# Patient Record
Sex: Female | Born: 1950
Health system: Southern US, Community
[De-identification: ages and names within clinical notes are randomized; demographics above are authoritative.]

## PROBLEM LIST (undated history)

## (undated) DIAGNOSIS — M1990 Unspecified osteoarthritis, unspecified site: Secondary | ICD-10-CM

## (undated) DIAGNOSIS — M79606 Pain in leg, unspecified: Secondary | ICD-10-CM

## (undated) DIAGNOSIS — E7800 Pure hypercholesterolemia, unspecified: Secondary | ICD-10-CM

## (undated) DIAGNOSIS — D649 Anemia, unspecified: Secondary | ICD-10-CM

## (undated) DIAGNOSIS — Z8619 Personal history of other infectious and parasitic diseases: Secondary | ICD-10-CM

## (undated) DIAGNOSIS — Z9289 Personal history of other medical treatment: Secondary | ICD-10-CM

## (undated) DIAGNOSIS — E785 Hyperlipidemia, unspecified: Secondary | ICD-10-CM

## (undated) DIAGNOSIS — K219 Gastro-esophageal reflux disease without esophagitis: Secondary | ICD-10-CM

## (undated) DIAGNOSIS — E039 Hypothyroidism, unspecified: Secondary | ICD-10-CM

## (undated) DIAGNOSIS — E079 Disorder of thyroid, unspecified: Secondary | ICD-10-CM

## (undated) HISTORY — DX: Pain in leg, unspecified: M79.606

## (undated) HISTORY — DX: Pure hypercholesterolemia, unspecified: E78.00

## (undated) HISTORY — DX: Disorder of thyroid, unspecified: E07.9

## (undated) HISTORY — DX: Unspecified osteoarthritis, unspecified site: M19.90

## (undated) HISTORY — PX: COLONOSCOPY: SHX174

## (undated) HISTORY — DX: Gastro-esophageal reflux disease without esophagitis: K21.9

## (undated) HISTORY — DX: Hyperlipidemia, unspecified: E78.5

---

## 1987-09-14 HISTORY — PX: DILATION AND CURETTAGE OF UTERUS: SHX78

## 1998-02-07 ENCOUNTER — Ambulatory Visit (HOSPITAL_COMMUNITY): Admission: RE | Admit: 1998-02-07 | Discharge: 1998-02-07 | Payer: Self-pay | Admitting: *Deleted

## 1998-03-07 ENCOUNTER — Other Ambulatory Visit: Admission: RE | Admit: 1998-03-07 | Discharge: 1998-03-07 | Payer: Self-pay | Admitting: *Deleted

## 1999-07-31 ENCOUNTER — Ambulatory Visit (HOSPITAL_COMMUNITY): Admission: RE | Admit: 1999-07-31 | Discharge: 1999-07-31 | Payer: Self-pay | Admitting: *Deleted

## 1999-07-31 ENCOUNTER — Encounter: Payer: Self-pay | Admitting: *Deleted

## 1999-08-14 ENCOUNTER — Other Ambulatory Visit: Admission: RE | Admit: 1999-08-14 | Discharge: 1999-08-14 | Payer: Self-pay | Admitting: Obstetrics & Gynecology

## 2000-01-29 ENCOUNTER — Ambulatory Visit (HOSPITAL_COMMUNITY): Admission: RE | Admit: 2000-01-29 | Discharge: 2000-01-29 | Payer: Self-pay | Admitting: Gastroenterology

## 2000-08-26 ENCOUNTER — Encounter (INDEPENDENT_AMBULATORY_CARE_PROVIDER_SITE_OTHER): Payer: Self-pay | Admitting: Specialist

## 2000-08-26 ENCOUNTER — Ambulatory Visit (HOSPITAL_COMMUNITY): Admission: RE | Admit: 2000-08-26 | Discharge: 2000-08-26 | Payer: Self-pay | Admitting: *Deleted

## 2000-08-26 ENCOUNTER — Encounter: Payer: Self-pay | Admitting: *Deleted

## 2000-08-26 ENCOUNTER — Other Ambulatory Visit: Admission: RE | Admit: 2000-08-26 | Discharge: 2000-08-26 | Payer: Self-pay | Admitting: Obstetrics and Gynecology

## 2001-01-10 ENCOUNTER — Other Ambulatory Visit: Admission: RE | Admit: 2001-01-10 | Discharge: 2001-01-10 | Payer: Self-pay | Admitting: Obstetrics & Gynecology

## 2001-01-10 ENCOUNTER — Encounter (INDEPENDENT_AMBULATORY_CARE_PROVIDER_SITE_OTHER): Payer: Self-pay | Admitting: Specialist

## 2001-09-22 ENCOUNTER — Ambulatory Visit (HOSPITAL_COMMUNITY): Admission: RE | Admit: 2001-09-22 | Discharge: 2001-09-22 | Payer: Self-pay | Admitting: *Deleted

## 2001-09-22 ENCOUNTER — Encounter: Admission: RE | Admit: 2001-09-22 | Discharge: 2001-09-22 | Payer: Self-pay | Admitting: Family Medicine

## 2001-09-22 ENCOUNTER — Encounter: Payer: Self-pay | Admitting: Family Medicine

## 2001-09-22 ENCOUNTER — Encounter: Payer: Self-pay | Admitting: *Deleted

## 2002-10-12 ENCOUNTER — Ambulatory Visit (HOSPITAL_COMMUNITY): Admission: RE | Admit: 2002-10-12 | Discharge: 2002-10-12 | Payer: Self-pay | Admitting: Family Medicine

## 2002-10-12 ENCOUNTER — Encounter: Payer: Self-pay | Admitting: Family Medicine

## 2002-11-23 ENCOUNTER — Other Ambulatory Visit: Admission: RE | Admit: 2002-11-23 | Discharge: 2002-11-23 | Payer: Self-pay | Admitting: Obstetrics and Gynecology

## 2003-11-22 ENCOUNTER — Encounter: Admission: RE | Admit: 2003-11-22 | Discharge: 2003-11-22 | Payer: Self-pay | Admitting: Obstetrics and Gynecology

## 2004-12-18 ENCOUNTER — Encounter: Admission: RE | Admit: 2004-12-18 | Discharge: 2004-12-18 | Payer: Self-pay | Admitting: Family Medicine

## 2005-06-04 ENCOUNTER — Encounter: Admission: RE | Admit: 2005-06-04 | Discharge: 2005-06-04 | Payer: Self-pay | Admitting: Family Medicine

## 2005-12-24 ENCOUNTER — Encounter: Admission: RE | Admit: 2005-12-24 | Discharge: 2005-12-24 | Payer: Self-pay | Admitting: Family Medicine

## 2007-01-27 ENCOUNTER — Encounter: Admission: RE | Admit: 2007-01-27 | Discharge: 2007-01-27 | Payer: Self-pay | Admitting: Family Medicine

## 2008-02-09 ENCOUNTER — Encounter: Admission: RE | Admit: 2008-02-09 | Discharge: 2008-02-09 | Payer: Self-pay | Admitting: Family Medicine

## 2009-02-14 ENCOUNTER — Encounter: Admission: RE | Admit: 2009-02-14 | Discharge: 2009-02-14 | Payer: Self-pay | Admitting: Obstetrics and Gynecology

## 2010-02-20 ENCOUNTER — Encounter: Admission: RE | Admit: 2010-02-20 | Discharge: 2010-02-20 | Payer: Self-pay | Admitting: Obstetrics and Gynecology

## 2011-01-29 NOTE — Op Note (Signed)
[facility]  Patient:    Kelsey Bradford, Kelsey Bradford                        MRN: [id number] Proc. Date: 01/29/00 Adm. Date:  [id number] Disc. Date: [id number] Attending:  Rich Brave CC:         Kendrick Ranch, M.D.                           Operative Report  PROCEDURE:  Colonoscopy.  INDICATIONS:  Screening for colon cancer in a 60 year old female.  FINDINGS:  Normal, except for rare colonic diverticulum.  PROCEDURE:  The nature, purpose and risks of the procedure had been discussed.  The patient provided written consent.  Sedation was fentanyl 25 mcg and Versed 4.5 mg IV; without arrhythmias or desaturation.  The Olympus adult video colonoscope was quite easily advanced into the cecum, turning the patient into the supine position to facilitate passage.  Pullback was then performed.  The quality of the prep was excellent, and it is felt that all areas were well seen.  There was a rare colonic diverticulum noted, but this was otherwise a normal examination; without evidence of any polyps or cancer, nor any colitis or vascular malformations.  Retroflexion in the rectum was normal.  No biopsies were obtained.  The patient tolerated the procedure well and there were no apparent complications.  IMPRESSION:  Minimal diverticulosis, otherwise normal examination.  PLAN:  Consider follow-up colonoscopy in 5-10 years for screening purposes. DD:  01/29/00 TD:  02/03/00 Job: [id number]

## 2011-03-12 ENCOUNTER — Other Ambulatory Visit: Payer: Self-pay | Admitting: Obstetrics and Gynecology

## 2011-03-12 DIAGNOSIS — Z1231 Encounter for screening mammogram for malignant neoplasm of breast: Secondary | ICD-10-CM

## 2011-04-09 ENCOUNTER — Ambulatory Visit
Admission: RE | Admit: 2011-04-09 | Discharge: 2011-04-09 | Disposition: A | Discharge status: Home / Self Care | Payer: BC Managed Care – PPO | Source: Ambulatory Visit | Attending: Obstetrics and Gynecology | Admitting: Obstetrics and Gynecology

## 2011-04-09 DIAGNOSIS — Z1231 Encounter for screening mammogram for malignant neoplasm of breast: Secondary | ICD-10-CM

## 2011-05-28 ENCOUNTER — Encounter: Payer: Self-pay | Admitting: Cardiology

## 2011-08-18 ENCOUNTER — Encounter (INDEPENDENT_AMBULATORY_CARE_PROVIDER_SITE_OTHER): Payer: Self-pay | Admitting: Surgery

## 2012-03-02 ENCOUNTER — Other Ambulatory Visit: Payer: Self-pay | Admitting: Obstetrics and Gynecology

## 2012-03-02 DIAGNOSIS — Z1231 Encounter for screening mammogram for malignant neoplasm of breast: Secondary | ICD-10-CM

## 2012-03-31 ENCOUNTER — Other Ambulatory Visit: Payer: Self-pay | Admitting: Gastroenterology

## 2012-03-31 DIAGNOSIS — R1900 Intra-abdominal and pelvic swelling, mass and lump, unspecified site: Secondary | ICD-10-CM

## 2012-04-07 ENCOUNTER — Ambulatory Visit
Admission: RE | Admit: 2012-04-07 | Discharge: 2012-04-07 | Disposition: A | Discharge status: Home / Self Care | Payer: BC Managed Care – PPO | Source: Ambulatory Visit | Attending: Gastroenterology | Admitting: Gastroenterology

## 2012-04-07 DIAGNOSIS — R1900 Intra-abdominal and pelvic swelling, mass and lump, unspecified site: Secondary | ICD-10-CM

## 2012-04-07 MED ORDER — IOHEXOL 300 MG/ML  SOLN
100.0000 mL | Freq: Once | INTRAMUSCULAR | Status: AC | PRN
  Administered 2012-04-07: 100 mL via INTRAVENOUS

## 2012-05-26 ENCOUNTER — Ambulatory Visit
Admission: RE | Admit: 2012-05-26 | Discharge: 2012-05-26 | Disposition: A | Discharge status: Home / Self Care | Payer: BC Managed Care – PPO | Source: Ambulatory Visit | Attending: Obstetrics and Gynecology | Admitting: Obstetrics and Gynecology

## 2012-05-26 DIAGNOSIS — Z1231 Encounter for screening mammogram for malignant neoplasm of breast: Secondary | ICD-10-CM

## 2012-07-14 ENCOUNTER — Encounter (INDEPENDENT_AMBULATORY_CARE_PROVIDER_SITE_OTHER): Payer: Self-pay | Admitting: Surgery

## 2012-07-14 ENCOUNTER — Ambulatory Visit (INDEPENDENT_AMBULATORY_CARE_PROVIDER_SITE_OTHER): Payer: BC Managed Care – PPO | Admitting: Surgery

## 2012-07-14 VITALS — BP 118/68 | HR 76 | Temp 97.6°F | Resp 16 | Ht 61.5 in | Wt 118.8 lb

## 2012-07-14 DIAGNOSIS — K8020 Calculus of gallbladder without cholecystitis without obstruction: Secondary | ICD-10-CM | POA: Insufficient documentation

## 2012-07-14 DIAGNOSIS — K439 Ventral hernia without obstruction or gangrene: Secondary | ICD-10-CM | POA: Insufficient documentation

## 2012-07-14 DIAGNOSIS — K5730 Diverticulosis of large intestine without perforation or abscess without bleeding: Secondary | ICD-10-CM

## 2012-07-14 NOTE — Patient Instructions (Signed)
Call and request a time when you want to schedule Spigelian hernia repair.  Would repair the right side and examine the left side and repair if necessary.  I would plan to do this with the laparoscope and possibly repair with a combined open technique.      PATIENT INSTRUCTIONS HERNIA  FOLLOW-UP:  Please make an appointment with your physician in 4 week(s).  Call your physician immediately if you have any fevers greater than 102.5, drainage from you wound that is not clear or looks infected, persistent bleeding, increasing abdominal pain, problems urinating, or persistent nausea/vomiting.    WOUND CARE INSTRUCTIONS:  Keep a dry clean dressing on the wound if there is drainage. The initial bandage may be removed after 24 hours.  Once the wound has quit draining you may leave it open to air.  If clothing rubs against the wound or causes irritation and the wound is not draining you may cover it with a dry dressing during the daytime.  Try to keep the wound dry and avoid ointments on the wound unless directed to do so.  If the wound becomes bright red and painful or starts to drain infected material that is not clear, please contact your physician immediately.  If the wound is mildly pink and has a thick firm ridge underneath it, this is normal, and is referred to as a healing ridge.  This will resolve over the next 4-6 weeks.  DIET:  You may eat any foods that you can tolerate.  It is a good idea to eat a high fiber diet and take in plenty of fluids to prevent constipation.  If you do become constipated you may want to take a mild laxative or take ducolax tablets on a daily basis until your bowel habits are regular.  Constipation can be very uncomfortable, along with straining, after recent abdominal surgery.  ACTIVITY:  You are encouraged to cough and deep breath or use your incentive spirometer if you were given one, every 15-30 minutes when awake.  This will help prevent respiratory complications and  low grade fevers post-operatively.  You may want to hug a pillow when coughing and sneezing to add additional support to the surgical area which will decrease pain during these times.  You are encouraged to walk and engage in light activity for the next two weeks.  You should not lift more than 20 pounds during this time frame as it could put you at increased risk for a hernia recurrence.  Twenty pounds is roughly equivalent to a plastic bag of groceries.    MEDICATIONS:  Try to take narcotic medications and anti-inflammatory medications, such as tylenol, ibuprofen, naprosyn, etc., with food.  This will minimize stomach upset from the medication.  Should you develop nausea and vomiting from the pain medication, or develop a rash, please discontinue the medication and contact your physician.  You should not drive, make important decisions, or operate machinery when taking narcotic pain medication.  QUESTIONS:  Please feel free to call your physician or the hospital operator if you have any questions, and they will be glad to assist you.

## 2012-07-14 NOTE — Progress Notes (Signed)
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  She underwent a CT scan which did not show any mass but showed a right Spigelian hernia and a smaller left hernia.  Also noted were gallstones.  She has been otherwise healthy and recently begun working out with a Systems analyst.    Past Medical History  Diagnosis Date  . GERD (gastroesophageal reflux disease)   . Hyperlipidemia   . Thyroid disease     History reviewed. No pertinent past surgical history.  Current Outpatient Prescriptions  Medication Sig Dispense Refill  . aspirin 81 MG tablet Take 81 mg by mouth daily.      . calcium carbonate (OS-CAL) 600 MG TABS Take 600 mg by mouth 2 (two) times daily with a meal.      . cholecalciferol (VITAMIN D) 1000 UNITS tablet Take 1,000 Units by mouth daily.      Marland Kitchen levothyroxine (SYNTHROID, LEVOTHROID) 50 MCG tablet Take 50 mcg by mouth daily.      Marland Kitchen omeprazole (PRILOSEC) 20 MG capsule Take 20 mg by mouth daily.      Marland Kitchen OVER THE COUNTER MEDICATION Multi vitamin      . rosuvastatin (CRESTOR) 10 MG tablet Take 10 mg by mouth daily.       Review of patient's allergies indicates no known allergies. Family History  Problem Relation Age of Onset  . Cancer Mother     breast  . Stroke Mother   . Diabetes Father    Social History:   reports that she has never smoked. She has never used smokeless tobacco. She reports that she drinks alcohol. She reports that she does not use illicit drugs.   REVIEW OF SYSTEMS - PERTINENT POSITIVES ONLY: No history of DVT or bleeding problems with surgey  Physical Exam:   Blood pressure 118/68, pulse 76, temperature 97.6 F (36.4 C), temperature source Temporal, resp. rate 16, height 5' 1.5" (1.562 m), weight 118 lb 12.8 oz (53.887 kg). Body mass index is 22.08 kg/(m^2).  Gen:   WDWN WF NAD  Neurological: Alert and oriented to person, place, and time. Motor and sensory function is grossly intact  Head: Normocephalic and atraumatic.  Eyes: Conjunctivae are normal. Pupils are equal, round, and reactive to light. No scleral icterus.  Neck: Normal range of motion. Neck supple. No tracheal deviation or thyromegaly present.  Cardiovascular:  SR without murmurs or gallops.  No carotid bruits Respiratory: Effort normal.  No respiratory distress. No chest wall tenderness. Breath sounds normal.  No wheezes, rales or rhonchi.  Abdomen:  Faint bulge in the right lower quadrant but not bulging out as much as she has noticed previously.   GU: Musculoskeletal: Normal range of motion. Extremities are nontender. No cyanosis, edema or clubbing noted Lymphadenopathy: No cervical, preauricular, postauricular or axillary adenopathy is present Skin: Skin is warm and dry. No rash noted. No diaphoresis. No erythema. No pallor. Pscyh: Normal mood and affect. Behavior is normal. Judgment and thought content normal.   LABORATORY RESULTS: No results found for this or any previous visit (from the past 48 hour(s)).  RADIOLOGY RESULTS: No results found.  Problem List: Patient Active Problem List  Diagnosis  . Gallstones-asymptomatic  . Diverticulosis of colon  . Spigelian hernia    Assessment & Plan: Symptomatic Spigelian hernia  on the right with asymptomatic hernia on the left.  Will schedule lap/open repair at [facility] when it suits her schedule.      Matt B. Daphine Deutscher, MD, [facility] [phone] beeper [phone]  07/14/2012 4:37 PM

## 2012-07-19 ENCOUNTER — Other Ambulatory Visit (INDEPENDENT_AMBULATORY_CARE_PROVIDER_SITE_OTHER): Payer: Self-pay | Admitting: Surgery

## 2012-08-25 ENCOUNTER — Encounter (HOSPITAL_COMMUNITY): Payer: Self-pay | Admitting: *Deleted

## 2012-08-25 ENCOUNTER — Other Ambulatory Visit (HOSPITAL_COMMUNITY): Payer: Self-pay | Admitting: *Deleted

## 2012-08-28 ENCOUNTER — Encounter (HOSPITAL_COMMUNITY): Payer: Self-pay | Admitting: Pharmacy Technician

## 2012-09-01 ENCOUNTER — Encounter (HOSPITAL_COMMUNITY): Payer: Self-pay | Admitting: *Deleted

## 2012-09-01 ENCOUNTER — Encounter (HOSPITAL_COMMUNITY): Payer: Self-pay | Admitting: Anesthesiology

## 2012-09-01 ENCOUNTER — Other Ambulatory Visit (INDEPENDENT_AMBULATORY_CARE_PROVIDER_SITE_OTHER): Payer: Self-pay

## 2012-09-01 ENCOUNTER — Encounter (HOSPITAL_COMMUNITY)
Admission: RE | Disposition: A | Discharge status: Other Acute Inpatient Hospital | Payer: Self-pay | Source: Ambulatory Visit | Attending: Surgery

## 2012-09-01 ENCOUNTER — Ambulatory Visit (HOSPITAL_COMMUNITY): Payer: BC Managed Care – PPO | Admitting: Anesthesiology

## 2012-09-01 ENCOUNTER — Ambulatory Visit (HOSPITAL_COMMUNITY)
Admission: RE | Admit: 2012-09-01 | Discharge: 2012-09-01 | Payer: BC Managed Care – PPO | Source: Ambulatory Visit | Attending: Surgery | Admitting: Surgery

## 2012-09-01 DIAGNOSIS — K219 Gastro-esophageal reflux disease without esophagitis: Secondary | ICD-10-CM | POA: Insufficient documentation

## 2012-09-01 DIAGNOSIS — K4090 Unilateral inguinal hernia, without obstruction or gangrene, not specified as recurrent: Secondary | ICD-10-CM

## 2012-09-01 DIAGNOSIS — E785 Hyperlipidemia, unspecified: Secondary | ICD-10-CM | POA: Insufficient documentation

## 2012-09-01 DIAGNOSIS — K439 Ventral hernia without obstruction or gangrene: Secondary | ICD-10-CM | POA: Insufficient documentation

## 2012-09-01 DIAGNOSIS — Z79899 Other long term (current) drug therapy: Secondary | ICD-10-CM | POA: Insufficient documentation

## 2012-09-01 DIAGNOSIS — E079 Disorder of thyroid, unspecified: Secondary | ICD-10-CM | POA: Insufficient documentation

## 2012-09-01 DIAGNOSIS — Z7982 Long term (current) use of aspirin: Secondary | ICD-10-CM | POA: Insufficient documentation

## 2012-09-01 HISTORY — PX: LAPAROSCOPIC ABDOMINAL EXPLORATION: SHX6249

## 2012-09-01 HISTORY — PX: SPIGELIAN HERNIA: SHX6100

## 2012-09-01 HISTORY — DX: Hypothyroidism, unspecified: E03.9

## 2012-09-01 HISTORY — PX: INSERTION OF MESH: SHX5868

## 2012-09-01 HISTORY — PX: INGUINAL HERNIA REPAIR: SHX194

## 2012-09-01 LAB — CBC
MCH: 30.7 pg (ref 26.0–34.0)
Platelets: 259 10*3/uL (ref 150–400)
RBC: 4.33 MIL/uL (ref 3.87–5.11)
RDW: 13.2 % (ref 11.5–15.5)
WBC: 5.3 10*3/uL (ref 4.0–10.5)

## 2012-09-01 SURGERY — REPAIR, HERNIA, INGUINAL, ADULT
Anesthesia: General | Laterality: Right | Wound class: Clean

## 2012-09-01 MED ORDER — ONDANSETRON HCL 4 MG PO TABS
4.0000 mg | ORAL_TABLET | Freq: Four times a day (QID) | ORAL | Status: DC | PRN
  Filled 2012-09-01: qty 1

## 2012-09-01 MED ORDER — HYDROCODONE-ACETAMINOPHEN 5-500 MG PO TABS: 1.0000 | ORAL_TABLET | ORAL | Status: DC | PRN

## 2012-09-01 MED ORDER — ACETAMINOPHEN 10 MG/ML IV SOLN
INTRAVENOUS | Status: DC | PRN
  Administered 2012-09-01: 1000 mg via INTRAVENOUS

## 2012-09-01 MED ORDER — DEXAMETHASONE SODIUM PHOSPHATE 10 MG/ML IJ SOLN
INTRAMUSCULAR | Status: DC | PRN
  Administered 2012-09-01: 10 mg via INTRAVENOUS

## 2012-09-01 MED ORDER — LACTATED RINGERS IV SOLN: INTRAVENOUS | Status: DC

## 2012-09-01 MED ORDER — PROMETHAZINE HCL 25 MG/ML IJ SOLN
6.2500 mg | INTRAMUSCULAR | Status: DC | PRN
  Administered 2012-09-01: 6.25 mg via INTRAVENOUS
  Filled 2012-09-01: qty 1

## 2012-09-01 MED ORDER — 0.9 % SODIUM CHLORIDE (POUR BTL) OPTIME
TOPICAL | Status: DC | PRN
  Administered 2012-09-01: 1000 mL

## 2012-09-01 MED ORDER — ESMOLOL HCL 10 MG/ML IV SOLN
INTRAVENOUS | Status: DC | PRN
  Administered 2012-09-01: 20 mg via INTRAVENOUS

## 2012-09-01 MED ORDER — EPHEDRINE SULFATE 50 MG/ML IJ SOLN
INTRAMUSCULAR | Status: DC | PRN
  Administered 2012-09-01: 5 mg via INTRAVENOUS

## 2012-09-01 MED ORDER — BUPIVACAINE-EPINEPHRINE PF 0.25-1:200000 % IJ SOLN
INTRAMUSCULAR | Status: AC
  Filled 2012-09-01: qty 30

## 2012-09-01 MED ORDER — LIDOCAINE HCL (CARDIAC) 20 MG/ML IV SOLN
INTRAVENOUS | Status: DC | PRN
  Administered 2012-09-01: 80 mg via INTRAVENOUS

## 2012-09-01 MED ORDER — MIDAZOLAM HCL 5 MG/5ML IJ SOLN
INTRAMUSCULAR | Status: DC | PRN
  Administered 2012-09-01: 2 mg via INTRAVENOUS

## 2012-09-01 MED ORDER — HEPARIN SODIUM (PORCINE) 5000 UNIT/ML IJ SOLN
5000.0000 [IU] | Freq: Once | INTRAMUSCULAR | Status: AC
  Administered 2012-09-01: 5000 [IU] via SUBCUTANEOUS
  Filled 2012-09-01: qty 1

## 2012-09-01 MED ORDER — FENTANYL CITRATE 0.05 MG/ML IJ SOLN
INTRAMUSCULAR | Status: DC | PRN
  Administered 2012-09-01 (×5): 50 ug via INTRAVENOUS

## 2012-09-01 MED ORDER — NEOSTIGMINE METHYLSULFATE 1 MG/ML IJ SOLN
INTRAMUSCULAR | Status: DC | PRN
  Administered 2012-09-01: 3 mg via INTRAVENOUS

## 2012-09-01 MED ORDER — MUPIROCIN 2 % EX OINT
TOPICAL_OINTMENT | Freq: Once | CUTANEOUS | Status: DC
  Filled 2012-09-01: qty 22

## 2012-09-01 MED ORDER — ONDANSETRON HCL 4 MG/2ML IJ SOLN
INTRAMUSCULAR | Status: DC | PRN
  Administered 2012-09-01: 4 mg via INTRAVENOUS

## 2012-09-01 MED ORDER — HEPARIN SODIUM (PORCINE) 5000 UNIT/ML IJ SOLN
5000.0000 [IU] | Freq: Three times a day (TID) | INTRAMUSCULAR | Status: DC
  Filled 2012-09-01 (×6): qty 1

## 2012-09-01 MED ORDER — CEFOXITIN SODIUM-DEXTROSE 1-4 GM-% IV SOLR (PREMIX)
INTRAVENOUS | Status: AC
  Filled 2012-09-01: qty 100

## 2012-09-01 MED ORDER — OXYCODONE-ACETAMINOPHEN 5-325 MG PO TABS: 1.0000 | ORAL_TABLET | ORAL | Status: DC | PRN

## 2012-09-01 MED ORDER — HEPARIN SODIUM (PORCINE) 5000 UNIT/ML IJ SOLN
5000.0000 [IU] | Freq: Three times a day (TID) | INTRAMUSCULAR | Status: DC
  Filled 2012-09-01 (×5): qty 1

## 2012-09-01 MED ORDER — MORPHINE SULFATE 10 MG/ML IJ SOLN: 1.0000 mg | INTRAMUSCULAR | Status: DC | PRN

## 2012-09-01 MED ORDER — ONDANSETRON HCL 4 MG/2ML IJ SOLN: 4.0000 mg | Freq: Four times a day (QID) | INTRAMUSCULAR | Status: DC | PRN

## 2012-09-01 MED ORDER — FLEET ENEMA 7-19 GM/118ML RE ENEM: 1.0000 | ENEMA | Freq: Once | RECTAL | Status: DC

## 2012-09-01 MED ORDER — BUPIVACAINE-EPINEPHRINE 0.25% -1:200000 IJ SOLN
INTRAMUSCULAR | Status: DC | PRN
  Administered 2012-09-01: 15 mL

## 2012-09-01 MED ORDER — PROPOFOL 10 MG/ML IV BOLUS
INTRAVENOUS | Status: DC | PRN
  Administered 2012-09-01: 150 mg via INTRAVENOUS

## 2012-09-01 MED ORDER — ROCURONIUM BROMIDE 100 MG/10ML IV SOLN
INTRAVENOUS | Status: DC | PRN
  Administered 2012-09-01: 5 mg via INTRAVENOUS
  Administered 2012-09-01: 10 mg via INTRAVENOUS
  Administered 2012-09-01: 30 mg via INTRAVENOUS

## 2012-09-01 MED ORDER — GLYCOPYRROLATE 0.2 MG/ML IJ SOLN
INTRAMUSCULAR | Status: DC | PRN
  Administered 2012-09-01: 0.4 mg via INTRAVENOUS

## 2012-09-01 MED ORDER — HYDROMORPHONE HCL PF 1 MG/ML IJ SOLN: 0.2500 mg | INTRAMUSCULAR | Status: DC | PRN

## 2012-09-01 MED ORDER — HYDROMORPHONE HCL PF 1 MG/ML IJ SOLN
INTRAMUSCULAR | Status: DC | PRN
  Administered 2012-09-01: 0.5 mg via INTRAVENOUS

## 2012-09-01 MED ORDER — LACTATED RINGERS IV SOLN
INTRAVENOUS | Status: DC | PRN
  Administered 2012-09-01 (×2): via INTRAVENOUS

## 2012-09-01 MED ORDER — DEXTROSE 5 % IV SOLN
2.0000 g | INTRAVENOUS | Status: AC
  Administered 2012-09-01: 2 g via INTRAVENOUS
  Filled 2012-09-01: qty 2

## 2012-09-01 MED ORDER — BUPIVACAINE LIPOSOME 1.3 % IJ SUSP
20.0000 mL | Freq: Once | INTRAMUSCULAR | Status: AC
  Administered 2012-09-01: 20 mL
  Filled 2012-09-01: qty 20

## 2012-09-01 MED ORDER — KCL IN DEXTROSE-NACL 20-5-0.45 MEQ/L-%-% IV SOLN: INTRAVENOUS | Status: DC

## 2012-09-01 MED ORDER — ACETAMINOPHEN 10 MG/ML IV SOLN
INTRAVENOUS | Status: AC
  Filled 2012-09-01: qty 100

## 2012-09-01 SURGICAL SUPPLY — 56 items
BINDER ABD UNIV 12 45-62 (WOUND CARE) IMPLANT
BINDER ABDOMINAL 46IN 62IN (WOUND CARE)
CANISTER SUCTION 2500CC (MISCELLANEOUS) ×4 IMPLANT
CANNULA ENDOPATH XCEL 11M (ENDOMECHANICALS) IMPLANT
CLOTH BEACON ORANGE TIMEOUT ST (SAFETY) ×4 IMPLANT
DECANTER SPIKE VIAL GLASS SM (MISCELLANEOUS) ×4 IMPLANT
DERMABOND ADVANCED (GAUZE/BANDAGES/DRESSINGS) ×3
DERMABOND ADVANCED .7 DNX12 (GAUZE/BANDAGES/DRESSINGS) ×9 IMPLANT
DEVICE SECURE STRAP 25 ABSORB (INSTRUMENTS) ×4 IMPLANT
DEVICE TROCAR PUNCTURE CLOSURE (ENDOMECHANICALS) ×4 IMPLANT
DISSECTOR BLUNT TIP ENDO 5MM (MISCELLANEOUS) IMPLANT
DRAIN CHANNEL RND F F (WOUND CARE) IMPLANT
DRAPE LAPAROSCOPIC ABDOMINAL (DRAPES) ×4 IMPLANT
ELECT CAUTERY BLADE 6.4 (BLADE) ×4 IMPLANT
ELECT REM PT RETURN 9FT ADLT (ELECTROSURGICAL) ×4
ELECTRODE REM PT RTRN 9FT ADLT (ELECTROSURGICAL) ×3 IMPLANT
EVACUATOR SILICONE 100CC (DRAIN) IMPLANT
GLOVE BIOGEL M 8.0 STRL (GLOVE) ×4 IMPLANT
GLOVE SURG SS PI 6.5 STRL IVOR (GLOVE) ×4 IMPLANT
GOWN STRL NON-REIN LRG LVL3 (GOWN DISPOSABLE) ×4 IMPLANT
GOWN STRL REIN XL XLG (GOWN DISPOSABLE) ×8 IMPLANT
HAND ACTIVATED (MISCELLANEOUS) IMPLANT
IV LACTATED RINGER IRRG 3000ML (IV SOLUTION)
IV LR IRRIG 3000ML ARTHROMATIC (IV SOLUTION) IMPLANT
KIT BASIN OR (CUSTOM PROCEDURE TRAY) ×4 IMPLANT
MARKER SKIN DUAL TIP RULER LAB (MISCELLANEOUS) ×4 IMPLANT
MESH BARD SOFT 2X4IN (Mesh General) ×4 IMPLANT
NEEDLE SPNL 22GX3.5 QUINCKE BK (NEEDLE) ×4 IMPLANT
NS IRRIG 1000ML POUR BTL (IV SOLUTION) ×4 IMPLANT
PATCH VENTRAL SMALL 4.3 (Mesh Specialty) ×4 IMPLANT
PENCIL BUTTON HOLSTER BLD 10FT (ELECTRODE) ×4 IMPLANT
SET IRRIG TUBING LAPAROSCOPIC (IRRIGATION / IRRIGATOR) IMPLANT
SLEEVE XCEL OPT CAN 5 100 (ENDOMECHANICALS) ×4 IMPLANT
SOLUTION ANTI FOG 6CC (MISCELLANEOUS) ×4 IMPLANT
STAPLER VISISTAT 35W (STAPLE) ×4 IMPLANT
SUT NOVA 0 T19/GS 22DT (SUTURE) IMPLANT
SUT NOVA 1 T20/GS 25DT (SUTURE) IMPLANT
SUT NOVA NAB DX-16 0-1 5-0 T12 (SUTURE) IMPLANT
SUT PROLENE 0 CT 1 CR/8 (SUTURE) IMPLANT
SUT PROLENE 2 0 CT2 30 (SUTURE) ×28 IMPLANT
SUT SILK 2 0 (SUTURE) ×1
SUT SILK 2-0 18XBRD TIE 12 (SUTURE) ×3 IMPLANT
SUT VIC AB 2-0 SH 27 (SUTURE) ×1
SUT VIC AB 2-0 SH 27X BRD (SUTURE) ×3 IMPLANT
SUT VIC AB 4-0 SH 18 (SUTURE) ×4 IMPLANT
SYR 30ML LL (SYRINGE) ×4 IMPLANT
TACKER 5MM HERNIA 3.5CML NAB (ENDOMECHANICALS) IMPLANT
TRAY FOLEY CATH 14FRSI W/METER (CATHETERS) ×4 IMPLANT
TRAY LAP CHOLE (CUSTOM PROCEDURE TRAY) ×4 IMPLANT
TROCAR BLADELESS OPT 5 100 (ENDOMECHANICALS) ×4 IMPLANT
TROCAR HASSON GELL 12X100 (TROCAR) IMPLANT
TROCAR Z-THREAD FIOS 11X100 BL (TROCAR) ×4 IMPLANT
TROCAR Z-THREAD FIOS 5X100MM (TROCAR) ×4 IMPLANT
TROCAR Z-THREAD SLEEVE 11X100 (TROCAR) IMPLANT
TUBING FILTER THERMOFLATOR (ELECTROSURGICAL) IMPLANT
TUBING INSUFFLATION 10FT LAP (TUBING) ×4 IMPLANT

## 2012-09-01 NOTE — H&P (Signed)
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 She underwent a CT scan which did not show any mass but showed a right Spigelian hernia and a smaller left hernia. Also noted were gallstones. She has been otherwise healthy and recently begun working out with a Systems analyst.  Past Medical History   Diagnosis  Date   .  GERD (gastroesophageal reflux disease)    .  Hyperlipidemia    .  Thyroid disease    History reviewed. No pertinent past surgical history.  Current Outpatient Prescriptions   Medication  Sig  Dispense  Refill   .  aspirin 81 MG tablet  Take 81 mg by mouth daily.     .  calcium carbonate (OS-CAL) 600 MG TABS  Take 600 mg by mouth 2 (two) times daily with a meal.     .  cholecalciferol (VITAMIN D) 1000 UNITS tablet  Take 1,000 Units by mouth daily.     Marland Kitchen  levothyroxine (SYNTHROID, LEVOTHROID) 50 MCG tablet  Take 50 mcg by mouth daily.     Marland Kitchen  omeprazole (PRILOSEC) 20 MG capsule  Take 20 mg by mouth daily.     Marland Kitchen  OVER THE COUNTER MEDICATION  Multi vitamin     .  rosuvastatin (CRESTOR) 10 MG tablet  Take 10 mg by mouth daily.     Review of patient's allergies indicates no known allergies.  Family History   Problem  Relation  Age of Onset   .  Cancer  Mother       breast    .  Stroke  Mother    .  Diabetes  Father    Social History: reports that she has never smoked. She has never used smokeless tobacco. She reports that she drinks alcohol. She reports that she does not use illicit drugs.  REVIEW OF SYSTEMS - PERTINENT POSITIVES ONLY:  No history of DVT or bleeding problems with surgey  Physical Exam:  Blood pressure 118/68, pulse 76, temperature 97.6 F (36.4 C), temperature source Temporal, resp. rate 16, height 5' 1.5" (1.562 m), weight 118 lb 12.8 oz (53.887 kg).  Body mass index is 22.08  kg/(m^2).  Gen: WDWN WF NAD  Neurological: Alert and oriented to person, place, and time. Motor and sensory function is grossly intact  Head: Normocephalic and atraumatic.  Eyes: Conjunctivae are normal. Pupils are equal, round, and reactive to light. No scleral icterus.  Neck: Normal range of motion. Neck supple. No tracheal deviation or thyromegaly present.  Cardiovascular: SR without murmurs or gallops. No carotid bruits  Respiratory: Effort normal. No respiratory distress. No chest wall tenderness. Breath sounds normal. No wheezes, rales or rhonchi.  Abdomen: Faint bulge in the right lower quadrant but not bulging out as much as she has noticed previously.  GU:  Musculoskeletal: Normal range of motion. Extremities are nontender. No cyanosis, edema or clubbing noted Lymphadenopathy: No cervical, preauricular, postauricular or axillary adenopathy is present Skin: Skin is warm and dry. No rash noted. No diaphoresis. No erythema. No pallor. Pscyh: Normal mood and affect. Behavior is normal. Judgment and thought content normal.  LABORATORY RESULTS:  No results found for this or any previous visit (from the past 48 hour(s)).  RADIOLOGY RESULTS:  No results found.  Problem List:  Patient Active Problem List  Diagnosis   .  Gallstones-asymptomatic   .  Diverticulosis of colon   .  Spigelian hernia   Assessment & Plan:  Symptomatic Spigelian hernia on the right with asymptomatic hernia on the left. For laparoscopic repair today.  She understands the indications and risks. Matt B. Daphine Deutscher, MD, [facility]  [phone] beeper  [phone]

## 2012-09-01 NOTE — Transfer of Care (Signed)
Immediate Anesthesia Transfer of Care Note  Patient: Kelsey Desanctis, MD  Procedure(s) Performed: Procedure(s) (LRB) with comments: HERNIA REPAIR INGUINAL ADULT (Right) - laparoscopic assisted LAPAROSCOPIC ABDOMINAL EXPLORATION (N/A) SPIGELIAN HERNIA (Right) - laparoscopic assisted INSERTION OF MESH (Right) - spigelian and inguinal  Patient Location: PACU  Anesthesia Type:General  Level of Consciousness: awake, alert , oriented and patient cooperative  Airway & Oxygen Therapy: Patient Spontanous Breathing and Patient connected to face mask oxygen  Post-op Assessment: Report given to PACU RN, Post -op Vital signs reviewed and stable and Patient moving all extremities  Post vital signs: Reviewed and stable  Complications: No apparent anesthesia complications

## 2012-09-01 NOTE — Anesthesia Preprocedure Evaluation (Addendum)
Anesthesia Evaluation  Patient identified by MRN, date of birth, ID band Patient awake    Reviewed: Allergy & Precautions, H&P , NPO status , Patient's Chart, lab work & pertinent test results  History of Anesthesia Complications Negative for: history of anesthetic complications  Airway Mallampati: II TM Distance: >3 FB Neck ROM: Full    Dental  (+) Teeth Intact and Dental Advisory Given   Pulmonary neg pulmonary ROS,  breath sounds clear to auscultation  Pulmonary exam normal       Cardiovascular negative cardio ROS  Rhythm:Regular Rate:Normal     Neuro/Psych negative neurological ROS  negative psych ROS   GI/Hepatic Neg liver ROS, GERD-  Medicated,  Endo/Other  Hypothyroidism   Renal/GU negative Renal ROS  negative genitourinary   Musculoskeletal negative musculoskeletal ROS (+)   Abdominal   Peds  Hematology negative hematology ROS (+)   Anesthesia Other Findings   Reproductive/Obstetrics negative OB ROS                         Anesthesia Physical Anesthesia Plan  ASA: I  Anesthesia Plan: General   Post-op Pain Management:    Induction: Intravenous  Airway Management Planned: Oral ETT  Additional Equipment:   Intra-op Plan:   Post-operative Plan: Extubation in OR  Informed Consent: I have reviewed the patients History and Physical, chart, labs and discussed the procedure including the risks, benefits and alternatives for the proposed anesthesia with the patient or authorized representative who has indicated his/her understanding and acceptance.   Dental advisory given  Plan Discussed with: CRNA  Anesthesia Plan Comments:         Anesthesia Quick Evaluation

## 2012-09-01 NOTE — Op Note (Signed)
Surgeon: Wenda Low, MD, FACS  Asst:  none  Anes:  general  Procedure: Lap assisted repair of right Spigellian hernia with Proceed mesh patch (4.3 cm); right indirect inguinal hernia repair  Diagnosis: Right Spigellian hernia and right indirect inguinal hernia  Complications: non  EBL:   minimal cc  Description of Procedure:  The patient was taken to room 11 and given general anesthesia. The abdomen was prepped with PCMX and draped sterilely. A timeout was performed. Access to the abdomen was achieved with a 0 5 mm Optiview through the right upper quadrant without difficulty. Abdomen was insufflated and surveyed with first a 0 scope and then an angle scope.  In the spot that I had marked preoperatively where she had pain she appeared to have an inguinal hernia. Measurement of the abdominal wall and identification of the linea semilunaris and its intersection with the lateral border of the rectus revealed a small (hernia. This was approached first a first holding a dissecting probe into the hernia sac which was probably centimeter in depth and then cutting down and from the outside and initially trying to repair it primarily with a figure-of-eight suture of Prolene. I felt that there is a to be too much tension on that so I elected to excise the sac and through the resultant 1/2 cm defect I inserted a piece of proceed mesh 4.3 cm in diameter. I sutured it to the perimeter with 2-0 Prolene to and attempted to tack it with a tacker but found that the tacker would not penetrate the proceed mesh. I then placed 3 2-0 Prolene sutures around the perimeter to further anchor it away from the defect and this secured nicely to the fascia gave a nice appearance flush up against the intra-abdominal wall.  Inguinal hernia was identified and again using laparoscopic vision and then I cut down on that a transverse fashion in its and found the external oblique to be splayed. Cold cut along the splayed fascia to  the external ring and then mobilized prominent hernia along with the gubernaculum. Elected to transect the gubernaculum and then reduced the hernia and the gubernaculum into the abdomen. I closed the floor with interrupted 2-0 Prolene. I cut a piece of soft roll between mesh and laid it on top of that and sutured it with interrupted sutures to the fascia. I then covered this with the external oblique which I closed with a running 2-0 Vicryl. Both wound incisions were injected with Exparel. The wounds were closed 4-0 Vicryl subcutaneously and with 4-0 Vicryl subcuticularly and with Dermabond. Dermabond was also used on the trocar sites. At the completion of the case when [facility] insufflated and surveyed both hernia repairs in and looked good from the inside. Did not see an appreciable left inguinal hernia or left spigelian hernia and opted not to intervene in that region. Patient are the procedure well taken recovery room in satisfactory condition.  Matt B. Daphine Deutscher, MD, [facility], [place] [phone]

## 2012-09-01 NOTE — Anesthesia Postprocedure Evaluation (Signed)
  Anesthesia Post-op Note  Patient: Orene Desanctis, MD  Procedure(s) Performed: Procedure(s) (LRB): HERNIA REPAIR INGUINAL ADULT (Right) LAPAROSCOPIC ABDOMINAL EXPLORATION (N/A) SPIGELIAN HERNIA (Right) INSERTION OF MESH (Right)  Patient Location: PACU  Anesthesia Type: General  Level of Consciousness: awake and alert   Airway and Oxygen Therapy: Patient Spontanous Breathing  Post-op Pain: mild  Post-op Assessment: Post-op Vital signs reviewed, Patient's Cardiovascular Status Stable, Respiratory Function Stable, Patent Airway and No signs of Nausea or vomiting  Last Vitals:  Filed Vitals:   09/01/12 1615  BP: 120/75  Pulse:   Temp: 36.3 C  Resp:     Post-op Vital Signs: stable   Complications: No apparent anesthesia complications

## 2012-09-01 NOTE — Preoperative (Signed)
Beta Blockers   Reason not to administer Beta Blockers:Not Applicable 

## 2012-09-04 ENCOUNTER — Encounter (HOSPITAL_COMMUNITY): Payer: Self-pay | Admitting: Surgery

## 2012-09-11 NOTE — Anesthesia Postprocedure Evaluation (Signed)
Anesthesia Post Note  Patient: Orene Desanctis, MD  Procedure(s) Performed: Procedure(s) (LRB): HERNIA REPAIR INGUINAL ADULT (Right) LAPAROSCOPIC ABDOMINAL EXPLORATION (N/A) SPIGELIAN HERNIA (Right) INSERTION OF MESH (Right)  Anesthesia type: General  Patient location: PACU  Post pain: Pain level controlled  Post assessment: Post-op Vital signs reviewed  Last Vitals:  Filed Vitals:   09/01/12 1915  BP: 117/81  Pulse: 64  Temp: 36.3 C  Resp: 16    Post vital signs: Reviewed  Level of consciousness: sedated  Complications: No apparent anesthesia complications

## 2012-09-15 NOTE — Discharge Summary (Signed)
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  Admit date: 09/01/2012 Discharge date: 09/01/12  Admission Diagnoses:  Abdominal wall herniae  Discharge Diagnoses:  same Active Problems:  * No active hospital problems. *    Surgery:  Repair of Spigelian hernia right and repair right inguinal hernia  Discharged Condition: good.  Was discharged from short stay and never went to the floor  Hospital Course:   Had surgery and was discharged from short stay  Consults:none  Significant Diagnostic Studies: none    Discharge Exam: Blood pressure 117/81, pulse 64, temperature 97.3 F (36.3 C), temperature source Oral, resp. rate 16, height 5\' 1"  (1.549 m), weight 118 lb (53.524 kg), SpO2 100.00%. Pain controlled  Disposition: [facility] Not Defined  Discharge Orders    Future Appointments: Provider: Department: Dept Phone: Center:   10/06/2012 10:30 AM Valarie Merino, MD [facility], [place] [phone] None       Medication List     As of 09/15/2012  7:19 AM    ASK your doctor about these medications         aspirin 81 MG tablet   Take 81 mg by mouth daily.      calcium carbonate 600 MG Tabs   Commonly known as: OS-CAL   Take 600 mg by mouth 2 (two) times daily with a meal.      cholecalciferol 1000 UNITS tablet   Commonly known as: VITAMIN D   Take 1,000 Units by mouth daily.      HYDROcodone-acetaminophen 5-500 MG per tablet   Commonly known as: VICODIN   Take 1 tablet by mouth every 4 (four) hours as needed for pain.      levothyroxine 50 MCG tablet   Commonly known as: SYNTHROID, LEVOTHROID   Take 50 mcg by mouth daily before breakfast.      multivitamin with minerals Tabs   Take 1 tablet by mouth daily.      omeprazole 20 MG capsule   Commonly known as: PRILOSEC   Take 20 mg by mouth daily before breakfast.      rosuvastatin 10 MG tablet   Commonly known as:  CRESTOR   Take 10 mg by mouth daily before breakfast.         Signed: Alfa Leibensperger B 09/15/2012, 7:19 AM

## 2012-09-19 ENCOUNTER — Encounter (INDEPENDENT_AMBULATORY_CARE_PROVIDER_SITE_OTHER): Payer: Self-pay | Admitting: Surgery

## 2012-09-19 ENCOUNTER — Ambulatory Visit (INDEPENDENT_AMBULATORY_CARE_PROVIDER_SITE_OTHER): Payer: BC Managed Care – PPO | Admitting: Surgery

## 2012-09-19 VITALS — BP 100/70 | HR 72 | Temp 97.5°F | Resp 18 | Wt 121.0 lb

## 2012-09-19 DIAGNOSIS — K4090 Unilateral inguinal hernia, without obstruction or gangrene, not specified as recurrent: Secondary | ICD-10-CM

## 2012-09-19 NOTE — Progress Notes (Signed)
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  There is no height on file to calculate BMI.  Patient Active Problem List  Diagnosis  . Gallstones-asymptomatic  . Diverticulosis of colon  . Spigelian hernia    No Known Allergies  Past Surgical History  Procedure Date  . Dilation and curettage of uterus 1989    for miscarriage  . Inguinal hernia repair 09/01/2012    Procedure: HERNIA REPAIR INGUINAL ADULT;  Surgeon: Valarie Merino, MD;  Location: [facility]S;  Service: General;  Laterality: Right;  laparoscopic assisted  . Laparoscopic abdominal exploration 09/01/2012    Procedure: LAPAROSCOPIC ABDOMINAL EXPLORATION;  Surgeon: Valarie Merino, MD;  Location: [facility];  Service: General;  Laterality: N/A;  . Spigelian hernia 09/01/2012    Procedure: SPIGELIAN HERNIA;  Surgeon: Valarie Merino, MD;  Location: [facility];  Service: General;  Laterality: Right;  laparoscopic assisted  . Insertion of mesh 09/01/2012    Procedure: INSERTION OF MESH;  Surgeon: Valarie Merino, MD;  Location: [facility];  Service: General;  Laterality: Right;  spigelian and inguinal   COUSINS,SHERONETTE A, MD No diagnosis found.  Kelsey Bradford had onset of sharp pain near the midline.  This is in the area of her spigelian hernia mesh repair. I think one of the sutures is pulled there and intermittently causes her to have sharp pain. I think I would treat this with Motrin and observation. Unscheduled see her back in the office after she returns from a conference in [place]. On palpation I feel no evidence for hernia either in the inguinal region or the spigelian region. Matt B. Daphine Deutscher, MD, [facility] [phone] beeper [phone]  09/19/2012 3:44 PM

## 2012-09-27 ENCOUNTER — Encounter (INDEPENDENT_AMBULATORY_CARE_PROVIDER_SITE_OTHER): Payer: BC Managed Care – PPO | Admitting: Surgery

## 2012-10-03 ENCOUNTER — Encounter (INDEPENDENT_AMBULATORY_CARE_PROVIDER_SITE_OTHER): Payer: Self-pay

## 2012-10-06 ENCOUNTER — Encounter (INDEPENDENT_AMBULATORY_CARE_PROVIDER_SITE_OTHER): Payer: Self-pay | Admitting: Surgery

## 2012-10-06 ENCOUNTER — Ambulatory Visit (INDEPENDENT_AMBULATORY_CARE_PROVIDER_SITE_OTHER): Payer: PRIVATE HEALTH INSURANCE | Admitting: Surgery

## 2012-10-06 VITALS — BP 102/82 | HR 72 | Temp 98.0°F | Resp 16 | Ht 61.5 in | Wt 120.0 lb

## 2012-10-06 DIAGNOSIS — Z9889 Other specified postprocedural states: Secondary | ICD-10-CM

## 2012-10-06 DIAGNOSIS — Z8719 Personal history of other diseases of the digestive system: Secondary | ICD-10-CM | POA: Insufficient documentation

## 2012-10-06 NOTE — Progress Notes (Signed)
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  Body mass index is 22.31 kg/(m^2).  Patient Active Problem List  Diagnosis  . Gallstones-asymptomatic  . Diverticulosis of colon    No Known Allergies  Past Surgical History  Procedure Date  . Dilation and curettage of uterus 1989    for miscarriage  . Inguinal hernia repair 09/01/2012    Procedure: HERNIA REPAIR INGUINAL ADULT;  Surgeon: Valarie Merino, MD;  Location: [facility];  Service: General;  Laterality: Right;  laparoscopic assisted  . Laparoscopic abdominal exploration 09/01/2012    Procedure: LAPAROSCOPIC ABDOMINAL EXPLORATION;  Surgeon: Valarie Merino, MD;  Location: [facility];  Service: General;  Laterality: N/A;  . Spigelian hernia 09/01/2012    Procedure: SPIGELIAN HERNIA;  Surgeon: Valarie Merino, MD;  Location: [facility];  Service: General;  Laterality: Right;  laparoscopic assisted  . Insertion of mesh 09/01/2012    Procedure: INSERTION OF MESH;  Surgeon: Valarie Merino, MD;  Location: [facility];  Service: General;  Laterality: Right;  spigelian and inguinal   COUSINS,SHERONETTE A, MD No diagnosis found.  Feeling better but having some numbness in the right groin.  This is bothering her somewhat.  Less pain at site of Spigellian hernia.  Advised to return to level of activity as desired.   Return in 3 months--may cancel if not needed.  Matt B. Daphine Deutscher, MD, [facility] [phone] beeper [phone]  10/06/2012 11:01 AM

## 2012-10-25 ENCOUNTER — Encounter (INDEPENDENT_AMBULATORY_CARE_PROVIDER_SITE_OTHER): Payer: BC Managed Care – PPO | Admitting: Surgery

## 2013-01-01 ENCOUNTER — Other Ambulatory Visit: Payer: Self-pay | Admitting: Internal Medicine

## 2013-01-01 DIAGNOSIS — M25551 Pain in right hip: Secondary | ICD-10-CM

## 2013-01-02 ENCOUNTER — Ambulatory Visit
Admission: RE | Admit: 2013-01-02 | Discharge: 2013-01-02 | Disposition: A | Discharge status: Home / Self Care | Payer: No Typology Code available for payment source | Source: Ambulatory Visit | Attending: Internal Medicine | Admitting: Internal Medicine

## 2013-01-02 DIAGNOSIS — M25551 Pain in right hip: Secondary | ICD-10-CM

## 2013-02-09 ENCOUNTER — Other Ambulatory Visit: Payer: Self-pay | Admitting: Orthopedic Surgery

## 2013-02-13 ENCOUNTER — Ambulatory Visit
Admission: RE | Admit: 2013-02-13 | Discharge: 2013-02-13 | Disposition: A | Discharge status: Home / Self Care | Payer: No Typology Code available for payment source | Source: Ambulatory Visit | Attending: Orthopedic Surgery | Admitting: Orthopedic Surgery

## 2013-02-16 ENCOUNTER — Ambulatory Visit (INDEPENDENT_AMBULATORY_CARE_PROVIDER_SITE_OTHER): Payer: No Typology Code available for payment source | Admitting: Neurology

## 2013-02-16 ENCOUNTER — Encounter: Payer: Self-pay | Admitting: Neurology

## 2013-02-16 VITALS — BP 115/80 | HR 81 | Temp 97.8°F | Ht 61.0 in | Wt 117.0 lb

## 2013-02-16 DIAGNOSIS — M79609 Pain in unspecified limb: Secondary | ICD-10-CM

## 2013-02-16 DIAGNOSIS — R269 Unspecified abnormalities of gait and mobility: Secondary | ICD-10-CM

## 2013-02-16 DIAGNOSIS — R29898 Other symptoms and signs involving the musculoskeletal system: Secondary | ICD-10-CM

## 2013-02-16 MED ORDER — PREDNISONE 5 MG PO TABS: 10.0000 mg | ORAL_TABLET | Freq: Every day | ORAL | Status: DC

## 2013-02-16 NOTE — Patient Instructions (Addendum)
Fall Prevention and Home Safety Falls cause injuries and can affect all age groups. It is possible to use preventive measures to significantly decrease the likelihood of falls. There are many simple measures which can make your home safer and prevent falls. OUTDOORS  Repair cracks and edges of walkways and driveways.  Remove high doorway thresholds.  Trim shrubbery on the main path into your home.  Have good outside lighting.  Clear walkways of tools, rocks, debris, and clutter.  Check that handrails are not broken and are securely fastened. Both sides of steps should have handrails.  Have leaves, snow, and ice cleared regularly.  Use sand or salt on walkways during winter months.  In the garage, clean up grease or oil spills. BATHROOM  Install night lights.  Install grab bars by the toilet and in the tub and shower.  Use non-skid mats or decals in the tub or shower.  Place a plastic non-slip stool in the shower to sit on, if needed.  Keep floors dry and clean up all water on the floor immediately.  Remove soap buildup in the tub or shower on a regular basis.  Secure bath mats with non-slip, double-sided rug tape.  Remove throw rugs and tripping hazards from the floors. BEDROOMS  Install night lights.  Make sure a bedside light is easy to reach.  Do not use oversized bedding.  Keep a telephone by your bedside.  Have a firm chair with side arms to use for getting dressed.  Remove throw rugs and tripping hazards from the floor. KITCHEN  Keep handles on pots and pans turned toward the center of the stove. Use back burners when possible.  Clean up spills quickly and allow time for drying.  Avoid walking on wet floors.  Avoid hot utensils and knives.  Position shelves so they are not too high or low.  Place commonly used objects within easy reach.  If necessary, use a sturdy step stool with a grab bar when reaching.  Keep electrical cables out of the  way.  Do not use floor polish or wax that makes floors slippery. If you must use wax, use non-skid floor wax.  Remove throw rugs and tripping hazards from the floor. STAIRWAYS  Never leave objects on stairs.  Place handrails on both sides of stairways and use them. Fix any loose handrails. Make sure handrails on both sides of the stairways are as long as the stairs.  Check carpeting to make sure it is firmly attached along stairs. Make repairs to worn or loose carpet promptly.  Avoid placing throw rugs at the top or bottom of stairways, or properly secure the rug with carpet tape to prevent slippage. Get rid of throw rugs, if possible.  Have an electrician put in a light switch at the top and bottom of the stairs. OTHER FALL PREVENTION TIPS  Wear low-heel or rubber-soled shoes that are supportive and fit well. Wear closed toe shoes.  When using a stepladder, make sure it is fully opened and both spreaders are firmly locked. Do not climb a closed stepladder.  Add color or contrast paint or tape to grab bars and handrails in your home. Place contrasting color strips on first and last steps.  Learn and use mobility aids as needed. Install an electrical emergency response system.  Turn on lights to avoid dark areas. Replace light bulbs that burn out immediately. Get light switches that glow.  Arrange furniture to create clear pathways. Keep furniture in the same place.    Firmly attach carpet with non-skid or double-sided tape.  Eliminate uneven floor surfaces.  Select a carpet pattern that does not visually hide the edge of steps.  Be aware of all pets. OTHER HOME SAFETY TIPS  Set the water temperature for 120 F (48.8 C).  Keep emergency numbers on or near the telephone.  Keep smoke detectors on every level of the home and near sleeping areas. Document Released: 08/20/2002 Document Revised: 02/29/2012 Document Reviewed: 11/19/2011 [facility] Patient Information 2014  [place], [place]. Myalgia, Adult Myalgia is the medical term for muscle pain. It is a symptom of many things. Nearly everyone at some time in their life has this. The most common cause for muscle pain is overuse or straining and more so when you are not in shape. Injuries and muscle bruises cause myalgias. Muscle pain without a history of injury can also be caused by a virus. It frequently comes along with the flu. Myalgia not caused by muscle strain can be present in a large number of infectious diseases. Some autoimmune diseases like lupus and fibromyalgia can cause muscle pain. Myalgia may be mild, or severe. SYMPTOMS  The symptoms of myalgia are simply muscle pain. Most of the time this is short lived and the pain goes away without treatment. DIAGNOSIS  Myalgia is diagnosed by your caregiver by taking your history. This means you tell him when the problems began, what they are, and what has been happening. If this has not been a long term problem, your caregiver may want to watch for a while to see what will happen. If it has been long term, they may want to do additional testing. TREATMENT  The treatment depends on what the underlying cause of the muscle pain is. Often anti-inflammatory medications will help. HOME CARE INSTRUCTIONS  If the pain in your muscles came from overuse, slow down your activities until the problems go away.  Myalgia from overuse of a muscle can be treated with alternating hot and cold packs on the muscle affected or with cold for the first couple days. If either heat or cold seems to make things worse, stop their use.  Apply ice to the sore area for 15-20 minutes, 3-4 times per day, while awake for the first 2 days of muscle soreness, or as directed. Put the ice in a plastic bag and place a towel between the bag of ice and your skin.  Only take over-the-counter or prescription medicines for pain, discomfort, or fever as directed by your caregiver.  Regular gentle  exercise may help if you are not active.  Stretching before strenuous exercise can help lower the risk of myalgia. It is normal when beginning an exercise regimen to feel some muscle pain after exercising. Muscles that have not been used frequently will be sore at first. If the pain is extreme, this may mean injury to a muscle. SEEK MEDICAL CARE IF:  You have an increase in muscle pain that is not relieved with medication.  You begin to run a temperature.  You develop nausea and vomiting.  You develop a stiff and painful neck.  You develop a rash.  You develop muscle pain after a tick bite.  You have continued muscle pain while working out even after you are in good condition. SEEK IMMEDIATE MEDICAL CARE IF: Any of your problems are getting worse and medications are not helping. MAKE SURE YOU:   Understand these instructions.  Will watch your condition.  Will get help right away if you are  not doing well or get worse. Document Released: 07/22/2006 Document Revised: 11/22/2011 Document Reviewed: 10/11/2006 [facility] Patient Information 2014 [place], [place].

## 2013-02-16 NOTE — Progress Notes (Signed)
[facility]  Provider:  Dr Elsy Chiang Referring Provider: Lunette Stands, MD Primary Care Physician:  Juline Patch, MD  Chief Complaint  Patient presents with  . 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 Charlett Blake for evaluation of hip pain and thigh pain, the latter is bilaterally present. .   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 She reports, that over the last 2-3 months she developed progressive hip pain mostly on the right and she had undergone imaging studies for Hip ( right ) and lower back through  the local orthopedist office.  An MRI of the right hip dated 01/02/2013 showed right sided (more than left ) hip osteoarthritis. Degeneration and tearing of the right acetabular labrum. Small bilateral hip joint effusion was noted , which is greater on the right.  Patient also complained about lateral thigh pain. She describes a deep pain , somewhat of a "core " deep bone  pain " , not  just soreness. The skin is neither burning nor are  pin and needles dysesthesias present . The pain and discomfort  seems to be independent of her level of exercise during the day . This sensation " breaks out"  in the middle of the night, it has kept her from sleeping at night for the last 2 nights ,until she took a pain pill. She reports a  progression of the intensity of the pain over 2 month  and she is not noticing quite as much pain during the daytime than  at night , when  at rest. 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 She " walks like a duck " she stated .   She described a different pain from her arthritic hip deep in both thighs. She was examined through her  local rheumatologist upon recommendation of one of her brother's, who is a rheumatologist ( the other is a neurologist).  A limb girdle muscle  examination was not done yet,    There was no bursitis at the hip found , and  Rheumatological  Blood panel results  were in normal range,  and a lumbal spinal stenosis was successfully ruled out by MRI .  We reviewed her MRI of the lumbar spine form 02-13-13 . No findings that would explain these pains. MRI hip quoted above . Labs from Dr Ricki Miller and Charlett Blake reviewed.   The patient had meanwhile discontinued Crestor and Pravachol in an attempt to find out if the statins may have contributed to her leg pain. There was no change in response to this.     Review of Systems: Out of a complete 14 system review, the patient complains of only the following symptoms, and all other reviewed systems are negative.  Patient reports gait changes, eversion of the feet- pointing out wards. Insomnia from pain , deep in her thighs. Weakness when lifting her feet off the ground , climbing stairs. No falls. Off note: She has today a vision change , a light flash in the left eye, probably unrelated. She denies any history of headaches , migraines and visual auras.      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 She has 2 healthy sons , both in college.  History   Social History  . Marital Status: Divorced    Spouse Name: N/A    Number of Children: 2  . Years of Education: med. sch.   Occupational History  .      dermatology specialist   Social History Main Topics  . Smoking status: Never Smoker   . Smokeless tobacco: Never Used  . Alcohol Use: Yes     Comment: 1-2 glasses of wine per month  . Drug Use: No  . Sexually Active: Not on file   Other Topics Concern  . Not on file   Social History Narrative  . No narrative on file    Family History  Problem Relation Age of Onset  . Cancer Mother     breast  . Stroke Mother   . Diabetes Father   . Multiple sclerosis Sister   . Multiple sclerosis Maternal Aunt     Past  Medical History  Diagnosis Date  . GERD (gastroesophageal reflux disease)   . Hyperlipidemia   . Thyroid disease   . Hypothyroidism   . Hypercholesteremia     Past Surgical History  Procedure Laterality Date  . Dilation and curettage of uterus  1989    for miscarriage  . Inguinal hernia repair  09/01/2012    Procedure: HERNIA REPAIR INGUINAL ADULT;  Surgeon: Valarie Merino, MD;  Location: [facility];  Service: General;  Laterality: Right;  laparoscopic assisted  . Laparoscopic abdominal exploration  09/01/2012    Procedure: LAPAROSCOPIC ABDOMINAL EXPLORATION;  Surgeon: Valarie Merino, MD;  Location: [facility];  Service: General;  Laterality: N/A;  . Spigelian hernia  09/01/2012    Procedure: SPIGELIAN HERNIA;  Surgeon: Valarie Merino, MD;  Location: [facility];  Service: General;  Laterality: Right;  laparoscopic assisted  . Insertion of mesh  09/01/2012    Procedure: INSERTION OF MESH;  Surgeon: Valarie Merino, MD;  Location: [facility];  Service: General;  Laterality: Right;  spigelian and inguinal    Current Outpatient Prescriptions  Medication Sig Dispense Refill  . aspirin 81 MG tablet Take 81 mg by mouth every other day.       . calcium carbonate (OS-CAL) 600 MG TABS Take 600 mg by mouth 2 (two) times daily with a meal.      . cholecalciferol (VITAMIN D) 1000 UNITS tablet Take 1,000 Units by mouth daily.      Marland Kitchen ibuprofen (ADVIL,MOTRIN) 800 MG tablet Take 800 mg by mouth every 8 (eight) hours as needed for pain. 3 times daily      . levothyroxine (SYNTHROID, LEVOTHROID) 50 MCG tablet Take 50 mcg by mouth daily before breakfast.       . Multiple Vitamin (MULTIVITAMIN WITH MINERALS) TABS Take 1 tablet by mouth daily.      Marland Kitchen omeprazole (PRILOSEC) 20 MG capsule Take 20 mg by mouth daily before breakfast.       . meloxicam (MOBIC) 15 MG tablet       . pravastatin (PRAVACHOL) 80 MG tablet       . predniSONE (DELTASONE) 5 MG tablet        No current facility-administered medications for  this visit.    Allergies as of 02/16/2013  . (No Known Allergies)    Vitals: BP 115/80  Pulse 81  Temp(Src) 97.8 F (36.6 C) (Oral)  Ht 5\' 1"  (1.549 m)  Wt 117 lb (53.071 kg)  BMI 22.12 kg/m2 Last Weight:  Wt Readings from Last 1 Encounters:  02/16/13  117 lb (53.071 kg)   Last Height:   Ht Readings from Last 1 Encounters:  02/16/13 5\' 1"  (1.549 m)   Vision Screening:   Physical exam:  General: The patient is awake, alert and appears not in acute distress. The patient is well groomed. Head: Normocephalic, atraumatic. Neck is supple. Mallampati 1- open airway , symmetric soft palate  , neck circumference:13 inches , no retrognathia. She can breath through either nasion.  Cardiovascular:  Regular rate and rhythm, without  murmurs or carotid bruit, and without distended neck veins. Respiratory: Lungs are clear to auscultation. Skin:  Without evidence of edema, or rash.  Trunk: BMI is  Normal, the patient is slender.  Neurologic exam : The patient is awake and alert, oriented to place and time.  Memory subjective  described as intact. There is a normal attention span & concentration ability.  Speech is fluent without dysarthria, dysphonia or aphasia. Mood and affect are appropriate.  Cranial nerves: Pupils are equal and briskly reactive to light. Funduscopic exam without evidence of pallor or edema. Extraocular movements  in vertical and horizontal planes intact and without nystagmus.  Visual fields by finger perimetry are intact. She has amblyopia in the left eye , diverted up and out.  Hearing to finger rub intact.  Facial sensation intact to fine touch. Facial motor strength is symmetric and tongue and uvula move midline.  Motor exam: Normal tone and normal muscle bulk . Hip flexion and -abduction is weaker than expected.  Adduction was strong . Quadriceps femoris bulk appears symmetric in both thighs.  Gait  Evaluation : The patient is flat-footed ( all her life), she walks  with both feet pointing outwards- and reports that she used to walk inverted ( pigeon toes), now the opposite  . She compensates with her lower back and walks  " penguin like "  or "duck like" . She can rise form a seated position with bracing herself.   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  Fine touch, pinprick and vibration were tested in all extremities.   External side of the left heal and foot  is slightly numb to fine touch.  There is no numbness between the hallux and next toe.  Proprioception is tested in the upper extremities only,  normal. Coordination: Rapid alternating movements in the fingers/hands is tested and normal. Finger-to-nose maneuver tested and normal without evidence of ataxia, dysmetria or tremor.   Deep tendon reflexes: in the  upper and lower extremities are symmetric and intact. Babinski maneuver response is  downgoing.   Assessment:  After physical and neurologic examination, review of laboratory studies, imaging, I recommend an evaluation with my neuromuscular laboratory for EMG nerve conduction study in both lower extremities. Patient had negative aldolase and CPK values through Dr. Tanna Savoy and Dierdre Forth. ESR was 38 and normal for age .   Plan:  Treatment plan and additional workup will be reviewed under Problem List.

## 2013-02-19 ENCOUNTER — Encounter: Payer: Self-pay | Admitting: Neurology

## 2013-02-19 DIAGNOSIS — R29898 Other symptoms and signs involving the musculoskeletal system: Secondary | ICD-10-CM | POA: Insufficient documentation

## 2013-02-19 NOTE — Assessment & Plan Note (Signed)
EMG and NCS with dr Marjory Lies or Anne Hahn requested.

## 2013-02-21 ENCOUNTER — Ambulatory Visit (INDEPENDENT_AMBULATORY_CARE_PROVIDER_SITE_OTHER): Payer: No Typology Code available for payment source | Admitting: Neurology

## 2013-02-21 ENCOUNTER — Encounter: Payer: Self-pay | Admitting: Neurology

## 2013-02-21 ENCOUNTER — Encounter (INDEPENDENT_AMBULATORY_CARE_PROVIDER_SITE_OTHER): Payer: No Typology Code available for payment source

## 2013-02-21 DIAGNOSIS — M79609 Pain in unspecified limb: Secondary | ICD-10-CM

## 2013-02-21 DIAGNOSIS — R269 Unspecified abnormalities of gait and mobility: Secondary | ICD-10-CM

## 2013-02-21 DIAGNOSIS — R29898 Other symptoms and signs involving the musculoskeletal system: Secondary | ICD-10-CM

## 2013-02-21 DIAGNOSIS — Z0289 Encounter for other administrative examinations: Secondary | ICD-10-CM

## 2013-02-21 DIAGNOSIS — M25569 Pain in unspecified knee: Secondary | ICD-10-CM

## 2013-02-21 NOTE — Progress Notes (Signed)
Quick Note:  Please relate EMG results . Mail copy to patient. . CD ______

## 2013-02-21 NOTE — Procedures (Signed)
History of present illness: 62 years old Caucasian female, with few month history of bilateral anterior thigh muscle deep achy pain, she denies lower back pain, also had bilateral hip pain, denied weakness, or sensory loss,  On physical examination: Bilateral lower extremity motor strength was normal, deep tendon reflexes were brisk and symmetric, sensory examination was intact to light touch and pinprick  Nerve conduction study:  Bilateral peroneal sensory responses were normal. Bilateral peroneal to EDB, and tibial motor responses were normal. Bilateral tibial reflexes were normal and symmetric.  Electromyography:  Selected needle examination was performed at right lower extremity muscles, and right lumbosacral paraspinal muscles.  Needle examination of right tibialis anterior, tibialis posterior, medial gastrocnemius, peroneal longus, vastus lateralis, biceps femoris short head was normal.  There was no spontaneous activity at right lumbosacral paraspinal muscles, right L4, 5, S1.   IN CONCLUSION: This is a normal study. There is no electrodiagnostic evidence of large fiber peripheral neuropathy, right lower extremity neuropathy, or right lumbosacral radiculopathy.

## 2013-04-27 ENCOUNTER — Other Ambulatory Visit: Payer: Self-pay

## 2013-04-27 DIAGNOSIS — Z1231 Encounter for screening mammogram for malignant neoplasm of breast: Secondary | ICD-10-CM

## 2013-06-05 ENCOUNTER — Encounter: Payer: Self-pay | Admitting: Physical Medicine & Rehabilitation

## 2013-06-05 ENCOUNTER — Ambulatory Visit (HOSPITAL_BASED_OUTPATIENT_CLINIC_OR_DEPARTMENT_OTHER): Payer: No Typology Code available for payment source | Admitting: Physical Medicine & Rehabilitation

## 2013-06-05 ENCOUNTER — Encounter: Payer: No Typology Code available for payment source | Attending: Physical Medicine & Rehabilitation

## 2013-06-05 VITALS — BP 112/81 | HR 74 | Resp 14 | Ht 61.5 in | Wt 119.0 lb

## 2013-06-05 DIAGNOSIS — M76899 Other specified enthesopathies of unspecified lower limb, excluding foot: Secondary | ICD-10-CM

## 2013-06-05 DIAGNOSIS — M7610 Psoas tendinitis, unspecified hip: Secondary | ICD-10-CM

## 2013-06-05 DIAGNOSIS — S73191A Other sprain of right hip, initial encounter: Secondary | ICD-10-CM

## 2013-06-05 DIAGNOSIS — M25559 Pain in unspecified hip: Secondary | ICD-10-CM | POA: Insufficient documentation

## 2013-06-05 DIAGNOSIS — M25859 Other specified joint disorders, unspecified hip: Secondary | ICD-10-CM | POA: Insufficient documentation

## 2013-06-05 DIAGNOSIS — M79609 Pain in unspecified limb: Secondary | ICD-10-CM | POA: Insufficient documentation

## 2013-06-05 DIAGNOSIS — S73199A Other sprain of unspecified hip, initial encounter: Secondary | ICD-10-CM | POA: Insufficient documentation

## 2013-06-05 DIAGNOSIS — IMO0002 Reserved for concepts with insufficient information to code with codable children: Secondary | ICD-10-CM

## 2013-06-05 DIAGNOSIS — X58XXXA Exposure to other specified factors, initial encounter: Secondary | ICD-10-CM | POA: Insufficient documentation

## 2013-06-05 DIAGNOSIS — M25569 Pain in unspecified knee: Secondary | ICD-10-CM | POA: Insufficient documentation

## 2013-06-05 NOTE — Patient Instructions (Addendum)
   1.  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  Has failed PT, NSAIDs.  Recommend intra-articular R hip injection under fluoro or ultrasound guideance.  May benefit from targeted PT from therapist experienced with this condition.  Possible referral  to [facility] in [place], Dr Huey Bienenstock. or to a therapist at [facility]  Right superior Acetabular tear may require repair.  Recommend ortho eval Dr Ginny Forth at the [facility]

## 2013-06-05 NOTE — Progress Notes (Signed)
Subjective:    Patient ID: Kelsey Desanctis, MD, female    DOB: 09-May-1951, 62 y.o.   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  Initially presented to orthopedics.  Xrays of knee were normal, xrays of hip showed mild to moderate degenerative changes R>L hip.  MRI of hip as below R superior acetabular labral tear. R> L OA, small effusions. Lumbar MRI showed mild facet arthropathy, no evidence of spinal stenosis, or nerve root impngement. Rheumatologic eval revealed no evidence of inflammatory arthritis No improvement with Ultrasound guided R troch bursa injection injection  Pain occurs with walking and standing, comfortable with sitting, but has severe ant thigh pain radiating to knee with attempted leg crossing.  "walks like a duck" Also pain with moving hips while supine Pain Inventory Average Pain 6 Pain Right Now 0 My pain is intermittent, aching and tightness  In the last 24 hours, has pain interfered with the following? General activity 8 Relation with others 8 Enjoyment of life 8 What TIME of day is your pain at its worst? evening and night Sleep (in general) Fair  Pain is worse with: walking and some activites Pain improves with: rest and pacing activities Relief from Meds: 0  Mobility walk without assistance how many minutes can you walk? 10 ability to climb steps?  yes do you drive?  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  no Clinical Data: Right hip pain for several months.  MRI OF THE RIGHT HIP WITHOUT CONTRAST 01/02/2013  Technique: Multiplanar, multisequence MR imaging was performed. No  intravenous contrast was administered.  Comparison: CT abdomen and pelvis 04/07/2012.  Findings: The patient has bilateral hip osteoarthritis which  appears worse on the right. There are associated small  bilateral  hip joint effusions. There is some subchondral edema in the  periphery of the right femoral head and small subchondral cysts in  the right acetabulum. The superior right acetabular labrum is  degenerated and torn. There is no fracture or avascular necrosis  of either hip. Sacroiliac joints and symphysis pubis appear  normal. No evidence of bursitis is identified. There is no mass.  All visualized tendons and muscles are intact. Imaged intra  abdominal contents are unremarkable.  IMPRESSION:  Right worse than left hip osteoarthritis. There is associated  degeneration and tearing of the right acetabular labrum. Small  bilateral hip joint effusion identified, greater on the right.   Physicians involved in your care Any changes since last visit?  no   Family History  Problem Relation Age of Onset  . Cancer Mother     breast  . Stroke Mother   . Diabetes Father   . Multiple sclerosis Sister   . Multiple sclerosis Maternal Aunt    History   Social History  . Marital Status: Divorced    Spouse Name: N/A    Number of Children: 2  . Years of Education: med. sch.   Occupational History  .      dermatology specialist   Social History Main Topics  . Smoking status: Never Smoker   . Smokeless tobacco: Never Used  . Alcohol Use: Yes     Comment: 1-2 glasses of wine per month  . Drug Use: No  . Sexual Activity: None   Other Topics Concern  . None   Social  History Narrative  . None   Past Surgical History  Procedure Laterality Date  . Dilation and curettage of uterus  1989    for miscarriage  . Inguinal hernia repair  09/01/2012    Procedure: HERNIA REPAIR INGUINAL ADULT;  Surgeon: Valarie Merino, MD;  Location: [facility];  Service: General;  Laterality: Right;  laparoscopic assisted  . Laparoscopic abdominal exploration  09/01/2012    Procedure: LAPAROSCOPIC ABDOMINAL EXPLORATION;  Surgeon: Valarie Merino, MD;  Location: [facility];  Service: General;  Laterality:  N/A;  . Spigelian hernia  09/01/2012    Procedure: SPIGELIAN HERNIA;  Surgeon: Valarie Merino, MD;  Location: [facility];  Service: General;  Laterality: Right;  laparoscopic assisted  . Insertion of mesh  09/01/2012    Procedure: INSERTION OF MESH;  Surgeon: Valarie Merino, MD;  Location: [facility];  Service: General;  Laterality: Right;  spigelian and inguinal   Past Medical History  Diagnosis Date  . GERD (gastroesophageal reflux disease)   . Hyperlipidemia   . Thyroid disease   . Hypothyroidism   . Hypercholesteremia    BP 112/81  Pulse 74  Resp 14  Ht 5' 1.5" (1.562 m)  Wt 119 lb (53.978 kg)  BMI 22.12 kg/m2  SpO2 97%   Review of Systems  Musculoskeletal: Positive for gait problem.       Bilateral hip to knee pain  All other systems reviewed and are negative.       Objective:   Physical Exam  Nursing note and vitals reviewed. Constitutional: She is oriented to person, place, and time. She appears well-developed and well-nourished.  HENT:  Head: Normocephalic and atraumatic.  Eyes: Conjunctivae and EOM are normal. Pupils are equal, round, and reactive to light.  Musculoskeletal:       Right knee: Normal.       Left knee: Normal.       Lumbar back: She exhibits normal range of motion and no tenderness.  Minimal levoconvex scoliosis  No evidence of pelvic obliquity  Pain with internal external hip rotation  No tenderness over greater trochanters  Mild weakness with hip abduction, Hip flexion with resistance is painful in ant thigh but not groin area  No tenderness over medial hip, inguinal, or TFL area  Pain with passive hip adduction> abduction and internal>external rotation    Neurological: She is alert and oriented to person, place, and time. She displays no atrophy. No sensory deficit. She exhibits normal muscle tone. Gait abnormal.  Reflex Scores:      Tricep reflexes are 2+ on the right side and 2+ on the left side.      Bicep reflexes are 2+ on the  right side and 2+ on the left side.      Brachioradialis reflexes are 2+ on the right side and 2+ on the left side.      Patellar reflexes are 2+ on the right side and 2+ on the left side.      Achilles reflexes are 2+ on the right side and 2+ on the left side. Ambulates with stiff legged gait, pes planus, valgus at ankles, external rotation at the hip           Assessment & Plan:  1.  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  Has failed PT, NSAIDs.  Recommend intra-articular R hip injection under fluoro or ultrasound guideance.  May benefit from targeted PT from therapist experienced with this condition.  Possible referral  to [facility] in [place], Dr Pamelia Hoit. or to a therapist at [facility]  Acetabular tear may require repair.  Recommend ortho eval Dr Ginny Forth at the [facility]

## 2013-06-07 ENCOUNTER — Ambulatory Visit
Admission: RE | Admit: 2013-06-07 | Discharge: 2013-06-07 | Disposition: A | Discharge status: Home / Self Care | Payer: No Typology Code available for payment source | Source: Ambulatory Visit

## 2013-06-07 DIAGNOSIS — Z1231 Encounter for screening mammogram for malignant neoplasm of breast: Secondary | ICD-10-CM

## 2013-07-19 ENCOUNTER — Other Ambulatory Visit: Payer: Self-pay

## 2013-08-16 ENCOUNTER — Ambulatory Visit (INDEPENDENT_AMBULATORY_CARE_PROVIDER_SITE_OTHER): Payer: PRIVATE HEALTH INSURANCE | Admitting: General Surgery

## 2013-08-17 ENCOUNTER — Encounter (INDEPENDENT_AMBULATORY_CARE_PROVIDER_SITE_OTHER): Payer: Self-pay | Admitting: Surgery

## 2013-08-17 ENCOUNTER — Ambulatory Visit (INDEPENDENT_AMBULATORY_CARE_PROVIDER_SITE_OTHER): Payer: Self-pay | Admitting: Surgery

## 2013-08-17 VITALS — BP 118/86 | HR 72 | Temp 97.8°F | Resp 14 | Ht 61.0 in | Wt 121.6 lb

## 2013-08-17 DIAGNOSIS — Z8719 Personal history of other diseases of the digestive system: Secondary | ICD-10-CM

## 2013-08-17 DIAGNOSIS — Z9889 Other specified postprocedural states: Secondary | ICD-10-CM

## 2013-08-17 NOTE — Progress Notes (Signed)
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  Right groin pain - with exercise/external rotation of right leg  I find no specific abnormality of concern.  I reviewed her surgery that she had last year for the right Spigellian hernia and right inguinal hernia with Dr. Daphine Deutscher.  I would continue her exercises to strengthen her legs without concern.  She could use some OTC NSAIDs for the pain.  She will be in touch with [place] PRN.  2.  2.1 x 1.4 cm RLQ soft tissue mass see at Dr. Cherly Hensen office on [place] of uncertain significance.  There was nothing there on abdominal/pelvic CT scan 04/07/2012. 3.  Leg issues/pain/somewhat limited motion  She has seen multiple physicians and undergone EMG/nerve conduction studies without a diagnosis.  She is now trying exercises and these exercises prompted this visit. 4.  Vaginal bleeding  Attributed to atrophic vaginitis 5.  Asymptomatic gall stones. 6.  Thyroid replacement    Chief Complaint  Patient presents with  . 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 Nelta Numbers office. 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 Daphine Deutscher 09/01/2012.  She did well with this repair. Then this year, she has noticed bilateral leg weakness, leg pain.  She has had trouble crossing her legs.  She has had an extensive workup.  She had a MRI of her right hip 4/22/2014She has seen ortho Lunette Stands) and rheumatology Alben Deeds).    She had a MRI of her spine 02/13/2013.  She saw Dr. Marla Roe who did nerve conduction studies and electromyography (02/21/2013) and these were normal.  Then she started doing exercises and noticed right groin pain.  One exercise in  particular when she raises her leg and externally rotates the leg causes right groin pain.  The same maneuver on the other side does not cause pain.  Also she has seen Dr. Nelta Numbers, who did an [place] of her pelvis and noted a 2.1 x 1.4 cm RLQ soft tissue mass. She is here to discuss her symptoms and finding.   Past Medical History  Diagnosis Date  . GERD (gastroesophageal reflux disease)   . Hyperlipidemia   . Thyroid disease   . Hypothyroidism   . Hypercholesteremia   . Leg pain     weakness, generalized  . Osteoarthritis     rt hip      Past Surgical History  Procedure Laterality Date  . Dilation and curettage of uterus  1989    for miscarriage  . Inguinal hernia repair  09/01/2012    Procedure: HERNIA REPAIR INGUINAL ADULT;  Surgeon: Valarie Merino, MD;  Location: [facility];  Service: General;  Laterality: Right;  laparoscopic assisted  . Laparoscopic abdominal exploration  09/01/2012    Procedure: LAPAROSCOPIC ABDOMINAL EXPLORATION;  Surgeon: Valarie Merino, MD;  Location: [facility];  Service: General;  Laterality: N/A;  . Spigelian hernia  09/01/2012    Procedure: SPIGELIAN HERNIA;  Surgeon: Valarie Merino, MD;  Location: [facility];  Service: General;  Laterality: Right;  laparoscopic assisted  . Insertion of mesh  09/01/2012  Procedure: INSERTION OF MESH;  Surgeon: Valarie Merino, MD;  Location: [facility];  Service: General;  Laterality: Right;  spigelian and inguinal      Current Outpatient Prescriptions  Medication Sig Dispense Refill  . calcium carbonate (OS-CAL) 600 MG TABS Take 600 mg by mouth 2 (two) times daily with a meal.      . celecoxib (CELEBREX) 200 MG capsule Take 200 mg by mouth 2 (two) times daily.      . cholecalciferol (VITAMIN D) 1000 UNITS tablet Take 1,000 Units by mouth daily.      Marland Kitchen ibuprofen (ADVIL,MOTRIN) 800 MG tablet Take 800 mg by mouth every 8 (eight) hours as needed for pain. 3 times daily      . levothyroxine (SYNTHROID, LEVOTHROID) 50 MCG  tablet Take 50 mcg by mouth daily before breakfast.       . Multiple Vitamin (MULTIVITAMIN WITH MINERALS) TABS Take 1 tablet by mouth daily.      . Red Yeast Rice Extract (RED YEAST RICE PO) Take by mouth.       No current facility-administered medications for this visit.    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  PHYSICAL EXAM: BP 118/86  Pulse 72  Temp(Src) 97.8 F (36.6 C) (Temporal)  Resp 14  Ht 5\' 1"  (1.549 m)  Wt 121 lb 9.6 oz (55.157 kg)  BMI 22.99 kg/m2  General: WN thinner WF who is alert and generally healthy appearing.  HEENT: Normal. Pupils equal.  Abdomen: Soft. Right lower abdominal scar and right groin scar from the hernia repair in 2013.  She has a 2 cm vague mass off the lateral edge of the right groin scar.  The mass does not change with valsalva or position.  It is minimally tender for the patient.  I did an US of the area.  There is a 1.4 cm non specific change in the abdominal wall which correlates with what I feel.  I did not take pictures.  I do not know how this correlates with Dr. Cherly Hensen [place] finding.  But this is all very consistent with scarring around where her hernia was repaired and I do not think that she has a recurrent hernia.  DATA REVIEWED: Notes from Dr. Cherly Hensen.  Ovidio Kin, MD,  [facility] [address] [place]  [address]   [place], [place]    [address] Phone:  [phone] FAX:  [phone]

## 2013-08-21 ENCOUNTER — Other Ambulatory Visit: Payer: Self-pay | Admitting: Orthopedic Surgery

## 2013-08-21 DIAGNOSIS — M25561 Pain in right knee: Secondary | ICD-10-CM

## 2013-08-24 ENCOUNTER — Ambulatory Visit
Admission: RE | Admit: 2013-08-24 | Discharge: 2013-08-24 | Disposition: A | Discharge status: Home / Self Care | Payer: No Typology Code available for payment source | Source: Ambulatory Visit | Attending: Orthopedic Surgery | Admitting: Orthopedic Surgery

## 2013-08-24 DIAGNOSIS — M25561 Pain in right knee: Secondary | ICD-10-CM

## 2013-09-10 ENCOUNTER — Encounter (INDEPENDENT_AMBULATORY_CARE_PROVIDER_SITE_OTHER): Payer: Self-pay

## 2013-09-25 ENCOUNTER — Other Ambulatory Visit (HOSPITAL_COMMUNITY): Payer: Self-pay | Admitting: Orthopaedic Surgery

## 2013-09-25 ENCOUNTER — Encounter (HOSPITAL_COMMUNITY): Payer: Self-pay | Admitting: Pharmacy Technician

## 2013-09-27 NOTE — Pre-Procedure Instructions (Signed)
[facility] Martha Clan, MD  09/27/2013   Your procedure is scheduled on:  Tuesday October 09, 2013 at 12:00 PM.  Report to [facility] En[facility] "A"  Admitting at 10:00 AM.  Call this number if you have problems the morning of surgery: [phone]   Remember:   Do not eat food or drink liquids after midnight.   Take these medicines the morning of surgery with A SIP OF WATER: Levothyroxine (Synthroid)   Do not wear jewelry, make-up or nail polish.  Do not wear lotions, powders, or perfumes. You may wear deodorant.  Do not shave 48 hours prior to surgery.   Do not bring valuables to the hospital.  [facility] is not responsible for any belongings or valuables.               Contacts, dentures or bridgework may not be worn into surgery.  Leave suitcase in the car. After surgery it may be brought to your room.  For patients admitted to the hospital, discharge time is determined by your treatment team.               Patients discharged the day of surgery will not be allowed to drive home.  Name and phone number of your driver: Family/Friend  Special Instructions: Shower the night before your surgery and morning of your surgery with CHG   Please read over the following fact sheets that you were given: Pain Booklet, Coughing and Deep Breathing, Blood Transfusion Information, Total Joint Packet, MRSA Information and Surgical Site Infection Prevention

## 2013-09-28 ENCOUNTER — Encounter (HOSPITAL_COMMUNITY)
Admission: RE | Admit: 2013-09-28 | Discharge: 2013-09-28 | Disposition: A | Discharge status: Home / Self Care | Payer: No Typology Code available for payment source | Source: Ambulatory Visit | Attending: Orthopaedic Surgery | Admitting: Orthopaedic Surgery

## 2013-09-28 ENCOUNTER — Encounter (HOSPITAL_COMMUNITY): Payer: Self-pay

## 2013-09-28 DIAGNOSIS — Z01812 Encounter for preprocedural laboratory examination: Secondary | ICD-10-CM | POA: Insufficient documentation

## 2013-09-28 LAB — BASIC METABOLIC PANEL
BUN: 15 mg/dL (ref 6–23)
CHLORIDE: 102 meq/L (ref 96–112)
CO2: 27 meq/L (ref 19–32)
CREATININE: 0.64 mg/dL (ref 0.50–1.10)
Calcium: 9.4 mg/dL (ref 8.4–10.5)
GFR calc Af Amer: >90 mL/min (ref >90)
GFR calc non Af Amer: >90 mL/min (ref >90)
GLUCOSE: 91 mg/dL (ref 70–99)
Potassium: 4.5 mEq/L (ref 3.7–5.3)
Sodium: 140 mEq/L (ref 137–147)

## 2013-09-28 LAB — URINALYSIS, ROUTINE W REFLEX MICROSCOPIC
BILIRUBIN URINE: NEGATIVE
Glucose, UA: NEGATIVE mg/dL
Hgb urine dipstick: NEGATIVE
Ketones, ur: NEGATIVE mg/dL
Leukocytes, UA: NEGATIVE
Nitrite: NEGATIVE
PROTEIN: NEGATIVE mg/dL
Specific Gravity, Urine: 1.017 (ref 1.005–1.030)
UROBILINOGEN UA: 0.2 mg/dL (ref 0.0–1.0)
pH: 6.5 (ref 5.0–8.0)

## 2013-09-28 LAB — CBC
HEMATOCRIT: 40.6 % (ref 36.0–46.0)
Hemoglobin: 14 g/dL (ref 12.0–15.0)
MCH: 31.4 pg (ref 26.0–34.0)
MCHC: 34.5 g/dL (ref 30.0–36.0)
MCV: 91 fL (ref 78.0–100.0)
Platelets: 277 10*3/uL (ref 150–400)
RBC: 4.46 MIL/uL (ref 3.87–5.11)
RDW: 12.8 % (ref 11.5–15.5)
WBC: 6.2 10*3/uL (ref 4.0–10.5)

## 2013-09-28 LAB — SURGICAL PCR SCREEN
MRSA, PCR: NEGATIVE
STAPHYLOCOCCUS AUREUS: NEGATIVE

## 2013-09-28 LAB — ABO/RH: ABO/RH(D): A POS

## 2013-10-08 MED ORDER — CEFAZOLIN SODIUM-DEXTROSE 2-3 GM-% IV SOLR
2.0000 g | INTRAVENOUS | Status: AC
  Administered 2013-10-09: 2 g via INTRAVENOUS
  Filled 2013-10-08: qty 50

## 2013-10-09 ENCOUNTER — Inpatient Hospital Stay (HOSPITAL_COMMUNITY): Payer: No Typology Code available for payment source

## 2013-10-09 ENCOUNTER — Encounter (HOSPITAL_COMMUNITY): Payer: No Typology Code available for payment source | Admitting: Anesthesiology

## 2013-10-09 ENCOUNTER — Encounter (HOSPITAL_COMMUNITY)
Admission: RE | Disposition: A | Discharge status: Skilled Nursing Facility | Payer: Self-pay | Source: Ambulatory Visit | Attending: Orthopaedic Surgery

## 2013-10-09 ENCOUNTER — Inpatient Hospital Stay (HOSPITAL_COMMUNITY)
Admission: RE | Admit: 2013-10-09 | Discharge: 2013-10-15 | DRG: 462 | Disposition: A | Discharge status: Skilled Nursing Facility | Payer: No Typology Code available for payment source | Source: Ambulatory Visit | Attending: Orthopaedic Surgery | Admitting: Orthopaedic Surgery

## 2013-10-09 ENCOUNTER — Inpatient Hospital Stay (HOSPITAL_COMMUNITY): Payer: No Typology Code available for payment source | Admitting: Anesthesiology

## 2013-10-09 ENCOUNTER — Encounter (HOSPITAL_COMMUNITY): Payer: Self-pay | Admitting: Anesthesiology

## 2013-10-09 DIAGNOSIS — Z96643 Presence of artificial hip joint, bilateral: Secondary | ICD-10-CM

## 2013-10-09 DIAGNOSIS — M1610 Unilateral primary osteoarthritis, unspecified hip: Principal | ICD-10-CM | POA: Diagnosis present

## 2013-10-09 DIAGNOSIS — D62 Acute posthemorrhagic anemia: Secondary | ICD-10-CM | POA: Diagnosis not present

## 2013-10-09 DIAGNOSIS — Z9289 Personal history of other medical treatment: Secondary | ICD-10-CM

## 2013-10-09 DIAGNOSIS — M169 Osteoarthritis of hip, unspecified: Secondary | ICD-10-CM

## 2013-10-09 DIAGNOSIS — E039 Hypothyroidism, unspecified: Secondary | ICD-10-CM | POA: Diagnosis present

## 2013-10-09 DIAGNOSIS — K219 Gastro-esophageal reflux disease without esophagitis: Secondary | ICD-10-CM | POA: Diagnosis present

## 2013-10-09 HISTORY — DX: Personal history of other medical treatment: Z92.89

## 2013-10-09 HISTORY — PX: BILATERAL ANTERIOR TOTAL HIP ARTHROPLASTY: SHX5567

## 2013-10-09 LAB — CBC
HCT: 27.1 % — ABNORMAL LOW (ref 36.0–46.0)
HEMOGLOBIN: 9.4 g/dL — AB (ref 12.0–15.0)
MCH: 30.1 pg (ref 26.0–34.0)
MCHC: 34.7 g/dL (ref 30.0–36.0)
MCV: 86.9 fL (ref 78.0–100.0)
Platelets: 153 10*3/uL (ref 150–400)
RBC: 3.12 MIL/uL — ABNORMAL LOW (ref 3.87–5.11)
RDW: 14.8 % (ref 11.5–15.5)
WBC: 7.9 10*3/uL (ref 4.0–10.5)

## 2013-10-09 LAB — PREPARE RBC (CROSSMATCH)

## 2013-10-09 SURGERY — ARTHROPLASTY, HIP, BILATERAL, TOTAL, ANTERIOR APPROACH
Anesthesia: Choice | Site: Hip | Laterality: Bilateral

## 2013-10-09 MED ORDER — ARTIFICIAL TEARS OP OINT
TOPICAL_OINTMENT | OPHTHALMIC | Status: DC | PRN
  Administered 2013-10-09: 1 via OPHTHALMIC

## 2013-10-09 MED ORDER — DOCUSATE SODIUM 100 MG PO CAPS
100.0000 mg | ORAL_CAPSULE | Freq: Two times a day (BID) | ORAL | Status: DC
  Administered 2013-10-09 – 2013-10-15 (×11): 100 mg via ORAL
  Filled 2013-10-09 (×14): qty 1

## 2013-10-09 MED ORDER — FENTANYL CITRATE 0.05 MG/ML IJ SOLN
INTRAMUSCULAR | Status: AC
  Filled 2013-10-09: qty 5

## 2013-10-09 MED ORDER — OXYCODONE HCL ER 10 MG PO T12A
10.0000 mg | EXTENDED_RELEASE_TABLET | Freq: Two times a day (BID) | ORAL | Status: DC
  Administered 2013-10-09 – 2013-10-11 (×4): 10 mg via ORAL
  Filled 2013-10-09 (×4): qty 1

## 2013-10-09 MED ORDER — TRANEXAMIC ACID 100 MG/ML IV SOLN
1000.0000 mg | INTRAVENOUS | Status: AC
  Administered 2013-10-09: 1000 mg via INTRAVENOUS
  Filled 2013-10-09: qty 10

## 2013-10-09 MED ORDER — DEXTROSE 5 % IV SOLN
INTRAVENOUS | Status: DC | PRN
  Administered 2013-10-09: 12:00:00 via INTRAVENOUS

## 2013-10-09 MED ORDER — LACTATED RINGERS IV SOLN
INTRAVENOUS | Status: DC
  Administered 2013-10-09: 11:00:00 via INTRAVENOUS

## 2013-10-09 MED ORDER — ACETAMINOPHEN 10 MG/ML IV SOLN
1000.0000 mg | Freq: Once | INTRAVENOUS | Status: AC
  Administered 2013-10-09: 1000 mg via INTRAVENOUS

## 2013-10-09 MED ORDER — FERROUS SULFATE 325 (65 FE) MG PO TABS
325.0000 mg | ORAL_TABLET | Freq: Three times a day (TID) | ORAL | Status: DC
  Administered 2013-10-09 – 2013-10-15 (×14): 325 mg via ORAL
  Filled 2013-10-09 (×20): qty 1

## 2013-10-09 MED ORDER — LACTATED RINGERS IV SOLN
INTRAVENOUS | Status: DC | PRN
  Administered 2013-10-09 (×2): via INTRAVENOUS

## 2013-10-09 MED ORDER — ONDANSETRON HCL 4 MG/2ML IJ SOLN
4.0000 mg | Freq: Four times a day (QID) | INTRAMUSCULAR | Status: DC | PRN
  Filled 2013-10-09: qty 2

## 2013-10-09 MED ORDER — ROCURONIUM BROMIDE 50 MG/5ML IV SOLN
INTRAVENOUS | Status: AC
  Filled 2013-10-09: qty 1

## 2013-10-09 MED ORDER — SODIUM CHLORIDE 0.9 % IR SOLN
Status: DC | PRN
  Administered 2013-10-09: 2000 mL

## 2013-10-09 MED ORDER — ACETAMINOPHEN 10 MG/ML IV SOLN
INTRAVENOUS | Status: AC
  Filled 2013-10-09: qty 100

## 2013-10-09 MED ORDER — SODIUM CHLORIDE 0.9 % IV SOLN
INTRAVENOUS | Status: DC
  Administered 2013-10-10: 75 mL/h via INTRAVENOUS

## 2013-10-09 MED ORDER — GLYCOPYRROLATE 0.2 MG/ML IJ SOLN
INTRAMUSCULAR | Status: DC | PRN
  Administered 2013-10-09: 0.6 mg via INTRAVENOUS

## 2013-10-09 MED ORDER — MENTHOL 3 MG MT LOZG: 1.0000 | LOZENGE | OROMUCOSAL | Status: DC | PRN

## 2013-10-09 MED ORDER — LIDOCAINE HCL (CARDIAC) 20 MG/ML IV SOLN
INTRAVENOUS | Status: DC | PRN
  Administered 2013-10-09: 30 mg via INTRAVENOUS

## 2013-10-09 MED ORDER — PANTOPRAZOLE SODIUM 20 MG PO TBEC
20.0000 mg | DELAYED_RELEASE_TABLET | Freq: Every day | ORAL | Status: DC
  Administered 2013-10-10 – 2013-10-15 (×5): 20 mg via ORAL
  Filled 2013-10-09 (×6): qty 1

## 2013-10-09 MED ORDER — LACTATED RINGERS IV SOLN
INTRAVENOUS | Status: DC | PRN
  Administered 2013-10-09 (×2): via INTRAVENOUS

## 2013-10-09 MED ORDER — PROPOFOL 10 MG/ML IV BOLUS
INTRAVENOUS | Status: DC | PRN
  Administered 2013-10-09: 90 mg via INTRAVENOUS

## 2013-10-09 MED ORDER — LEVOTHYROXINE SODIUM 50 MCG PO TABS
50.0000 ug | ORAL_TABLET | Freq: Every day | ORAL | Status: DC
  Administered 2013-10-10 – 2013-10-15 (×6): 50 ug via ORAL
  Filled 2013-10-09 (×7): qty 1

## 2013-10-09 MED ORDER — METHOCARBAMOL 100 MG/ML IJ SOLN
500.0000 mg | Freq: Four times a day (QID) | INTRAMUSCULAR | Status: DC | PRN
  Filled 2013-10-09: qty 5

## 2013-10-09 MED ORDER — ZOLPIDEM TARTRATE 5 MG PO TABS
5.0000 mg | ORAL_TABLET | Freq: Every evening | ORAL | Status: DC | PRN
  Administered 2013-10-15: 5 mg via ORAL
  Filled 2013-10-09: qty 1

## 2013-10-09 MED ORDER — OXYCODONE HCL 5 MG PO TABS
ORAL_TABLET | ORAL | Status: DC
Start: 2013-10-09 — End: 2013-10-09
  Filled 2013-10-09: qty 2

## 2013-10-09 MED ORDER — ALBUMIN HUMAN 5 % IV SOLN
INTRAVENOUS | Status: AC
  Filled 2013-10-09: qty 500

## 2013-10-09 MED ORDER — POLYETHYLENE GLYCOL 3350 17 G PO PACK: 17.0000 g | PACK | Freq: Every day | ORAL | Status: DC | PRN

## 2013-10-09 MED ORDER — GLYCOPYRROLATE 0.2 MG/ML IJ SOLN
INTRAMUSCULAR | Status: AC
  Filled 2013-10-09: qty 3

## 2013-10-09 MED ORDER — ONDANSETRON HCL 4 MG/2ML IJ SOLN
INTRAMUSCULAR | Status: AC
  Filled 2013-10-09: qty 2

## 2013-10-09 MED ORDER — DEXMEDETOMIDINE HCL IN NACL 200 MCG/50ML IV SOLN: 0.4000 ug/kg/h | INTRAVENOUS | Status: DC

## 2013-10-09 MED ORDER — HYDROMORPHONE HCL PF 1 MG/ML IJ SOLN
0.2500 mg | INTRAMUSCULAR | Status: DC | PRN
  Administered 2013-10-09: 0.5 mg via INTRAVENOUS

## 2013-10-09 MED ORDER — ACETAMINOPHEN 650 MG RE SUPP: 650.0000 mg | Freq: Four times a day (QID) | RECTAL | Status: DC | PRN

## 2013-10-09 MED ORDER — ACETAMINOPHEN 325 MG PO TABS
650.0000 mg | ORAL_TABLET | Freq: Four times a day (QID) | ORAL | Status: DC | PRN
  Administered 2013-10-13 – 2013-10-15 (×2): 650 mg via ORAL
  Filled 2013-10-09 (×2): qty 2

## 2013-10-09 MED ORDER — METHOCARBAMOL 500 MG PO TABS
500.0000 mg | ORAL_TABLET | Freq: Four times a day (QID) | ORAL | Status: DC | PRN
  Administered 2013-10-10 – 2013-10-11 (×6): 500 mg via ORAL
  Filled 2013-10-09 (×6): qty 1

## 2013-10-09 MED ORDER — METOCLOPRAMIDE HCL 5 MG PO TABS
5.0000 mg | ORAL_TABLET | Freq: Three times a day (TID) | ORAL | Status: DC | PRN
  Filled 2013-10-09: qty 2

## 2013-10-09 MED ORDER — DEXAMETHASONE SODIUM PHOSPHATE 4 MG/ML IJ SOLN
INTRAMUSCULAR | Status: AC
  Filled 2013-10-09: qty 1

## 2013-10-09 MED ORDER — DEXMEDETOMIDINE HCL IN NACL 200 MCG/50ML IV SOLN
0.4000 ug/kg/h | INTRAVENOUS | Status: DC
  Filled 2013-10-09: qty 50

## 2013-10-09 MED ORDER — PHENYLEPHRINE 40 MCG/ML (10ML) SYRINGE FOR IV PUSH (FOR BLOOD PRESSURE SUPPORT)
PREFILLED_SYRINGE | INTRAVENOUS | Status: AC
  Filled 2013-10-09: qty 10

## 2013-10-09 MED ORDER — CHLORHEXIDINE GLUCONATE 4 % EX LIQD: 60.0000 mL | Freq: Once | CUTANEOUS | Status: DC

## 2013-10-09 MED ORDER — DIPHENHYDRAMINE HCL 12.5 MG/5ML PO ELIX: 12.5000 mg | ORAL_SOLUTION | ORAL | Status: DC | PRN

## 2013-10-09 MED ORDER — CEFAZOLIN SODIUM 1-5 GM-% IV SOLN
1.0000 g | Freq: Four times a day (QID) | INTRAVENOUS | Status: AC
  Administered 2013-10-09 – 2013-10-10 (×2): 1 g via INTRAVENOUS
  Filled 2013-10-09 (×2): qty 50

## 2013-10-09 MED ORDER — MIDAZOLAM HCL 2 MG/2ML IJ SOLN
INTRAMUSCULAR | Status: AC
  Filled 2013-10-09: qty 2

## 2013-10-09 MED ORDER — HYDROMORPHONE HCL PF 1 MG/ML IJ SOLN
INTRAMUSCULAR | Status: AC
  Administered 2013-10-10: 1 mg via INTRAVENOUS
  Filled 2013-10-09: qty 2

## 2013-10-09 MED ORDER — PHENYLEPHRINE HCL 10 MG/ML IJ SOLN
30.0000 ug/min | INTRAVENOUS | Status: DC
  Administered 2013-10-09: 20 ug/min via INTRAVENOUS

## 2013-10-09 MED ORDER — METOCLOPRAMIDE HCL 5 MG/ML IJ SOLN
5.0000 mg | Freq: Three times a day (TID) | INTRAMUSCULAR | Status: DC | PRN
  Administered 2013-10-09: 10 mg via INTRAVENOUS
  Filled 2013-10-09: qty 2

## 2013-10-09 MED ORDER — SODIUM CHLORIDE 0.9 % IV SOLN
INTRAVENOUS | Status: DC | PRN
  Administered 2013-10-09: 13:00:00 via INTRAVENOUS

## 2013-10-09 MED ORDER — PHENYLEPHRINE HCL 10 MG/ML IJ SOLN
INTRAMUSCULAR | Status: DC | PRN
  Administered 2013-10-09 (×4): 40 ug via INTRAVENOUS
  Administered 2013-10-09: 80 ug via INTRAVENOUS
  Administered 2013-10-09 (×2): 40 ug via INTRAVENOUS
  Administered 2013-10-09 (×2): 80 ug via INTRAVENOUS
  Administered 2013-10-09: 40 ug via INTRAVENOUS
  Administered 2013-10-09: 80 ug via INTRAVENOUS

## 2013-10-09 MED ORDER — FENTANYL CITRATE 0.05 MG/ML IJ SOLN
INTRAMUSCULAR | Status: DC | PRN
  Administered 2013-10-09: 100 ug via INTRAVENOUS
  Administered 2013-10-09: 50 ug via INTRAVENOUS
  Administered 2013-10-09: 100 ug via INTRAVENOUS
  Administered 2013-10-09: 25 ug via INTRAVENOUS
  Administered 2013-10-09: 100 ug via INTRAVENOUS
  Administered 2013-10-09: 50 ug via INTRAVENOUS
  Administered 2013-10-09: 25 ug via INTRAVENOUS
  Administered 2013-10-09: 50 ug via INTRAVENOUS

## 2013-10-09 MED ORDER — MIDAZOLAM HCL 5 MG/5ML IJ SOLN
INTRAMUSCULAR | Status: DC | PRN
  Administered 2013-10-09: 1 mg via INTRAVENOUS

## 2013-10-09 MED ORDER — HYDROMORPHONE HCL PF 1 MG/ML IJ SOLN
1.0000 mg | INTRAMUSCULAR | Status: DC | PRN
  Administered 2013-10-09 – 2013-10-11 (×6): 1 mg via INTRAVENOUS
  Filled 2013-10-09 (×6): qty 1

## 2013-10-09 MED ORDER — PROPOFOL 10 MG/ML IV BOLUS
INTRAVENOUS | Status: AC
  Filled 2013-10-09: qty 20

## 2013-10-09 MED ORDER — NEOSTIGMINE METHYLSULFATE 1 MG/ML IJ SOLN
INTRAMUSCULAR | Status: AC
  Filled 2013-10-09: qty 10

## 2013-10-09 MED ORDER — NEOSTIGMINE METHYLSULFATE 1 MG/ML IJ SOLN
INTRAMUSCULAR | Status: DC | PRN
  Administered 2013-10-09: 4 mg via INTRAVENOUS

## 2013-10-09 MED ORDER — PHENOL 1.4 % MT LIQD: 1.0000 | OROMUCOSAL | Status: DC | PRN

## 2013-10-09 MED ORDER — DEXAMETHASONE SODIUM PHOSPHATE 4 MG/ML IJ SOLN
INTRAMUSCULAR | Status: DC | PRN
  Administered 2013-10-09: 8 mg via INTRAVENOUS

## 2013-10-09 MED ORDER — CHLORHEXIDINE GLUCONATE 4 % EX LIQD
60.0000 mL | Freq: Once | CUTANEOUS | Status: DC
Start: 2013-10-10 — End: 2013-10-09

## 2013-10-09 MED ORDER — ALBUMIN HUMAN 5 % IV SOLN
INTRAVENOUS | Status: DC | PRN
  Administered 2013-10-09 (×3): via INTRAVENOUS

## 2013-10-09 MED ORDER — RIVAROXABAN 10 MG PO TABS
10.0000 mg | ORAL_TABLET | Freq: Every day | ORAL | Status: DC
  Administered 2013-10-10 – 2013-10-15 (×6): 10 mg via ORAL
  Filled 2013-10-09 (×9): qty 1

## 2013-10-09 MED ORDER — ALBUMIN HUMAN 5 % IV SOLN
12.5000 g | Freq: Once | INTRAVENOUS | Status: AC
  Administered 2013-10-09: 12.5 g via INTRAVENOUS

## 2013-10-09 MED ORDER — LIDOCAINE HCL (CARDIAC) 20 MG/ML IV SOLN
INTRAVENOUS | Status: AC
  Filled 2013-10-09: qty 5

## 2013-10-09 MED ORDER — ONDANSETRON HCL 4 MG PO TABS: 4.0000 mg | ORAL_TABLET | Freq: Four times a day (QID) | ORAL | Status: DC | PRN

## 2013-10-09 MED ORDER — ROCURONIUM BROMIDE 100 MG/10ML IV SOLN
INTRAVENOUS | Status: DC | PRN
  Administered 2013-10-09: 10 mg via INTRAVENOUS
  Administered 2013-10-09: 40 mg via INTRAVENOUS

## 2013-10-09 MED ORDER — ONDANSETRON HCL 4 MG/2ML IJ SOLN
INTRAMUSCULAR | Status: DC | PRN
  Administered 2013-10-09 (×2): 4 mg via INTRAVENOUS

## 2013-10-09 MED ORDER — OXYCODONE HCL 5 MG PO TABS
5.0000 mg | ORAL_TABLET | ORAL | Status: DC | PRN
  Administered 2013-10-10 – 2013-10-13 (×12): 10 mg via ORAL
  Administered 2013-10-14 (×2): 5 mg via ORAL
  Filled 2013-10-09 (×9): qty 2
  Filled 2013-10-09 (×2): qty 1
  Filled 2013-10-09 (×3): qty 2

## 2013-10-09 MED ORDER — SODIUM CHLORIDE 0.9 % IV SOLN: 1000.0000 mg | INTRAVENOUS | Status: DC

## 2013-10-09 MED ORDER — 0.9 % SODIUM CHLORIDE (POUR BTL) OPTIME
TOPICAL | Status: DC | PRN
  Administered 2013-10-09: 1000 mL

## 2013-10-09 MED ORDER — ALUM & MAG HYDROXIDE-SIMETH 200-200-20 MG/5ML PO SUSP: 30.0000 mL | ORAL | Status: DC | PRN

## 2013-10-09 SURGICAL SUPPLY — 53 items
BLADE SAW SGTL 18X1.27X75 (BLADE) ×2 IMPLANT
BLADE SURG ROTATE 9660 (MISCELLANEOUS) IMPLANT
CAPT HIP PF COP ×4 IMPLANT
CELLS DAT CNTRL 66122 CELL SVR (MISCELLANEOUS) ×2 IMPLANT
CLOTH BEACON ORANGE TIMEOUT ST (SAFETY) ×2 IMPLANT
COVER SURGICAL LIGHT HANDLE (MISCELLANEOUS) ×2 IMPLANT
DRAPE C-ARM 42X72 X-RAY (DRAPES) ×4 IMPLANT
DRAPE STERI IOBAN 125X83 (DRAPES) ×4 IMPLANT
DRAPE U-SHAPE 47X51 STRL (DRAPES) ×14 IMPLANT
DRSG AQUACEL AG ADV 3.5X10 (GAUZE/BANDAGES/DRESSINGS) ×2 IMPLANT
DURAPREP 26ML APPLICATOR (WOUND CARE) ×4 IMPLANT
ELECT BLADE 4.0 EZ CLEAN MEGAD (MISCELLANEOUS)
ELECT BLADE TIP CTD 4 INCH (ELECTRODE) ×2 IMPLANT
ELECT CAUTERY BLADE 6.4 (BLADE) IMPLANT
ELECT REM PT RETURN 9FT ADLT (ELECTROSURGICAL) ×2
ELECTRODE BLDE 4.0 EZ CLN MEGD (MISCELLANEOUS) IMPLANT
ELECTRODE REM PT RTRN 9FT ADLT (ELECTROSURGICAL) ×1 IMPLANT
FACESHIELD LNG OPTICON STERILE (SAFETY) ×8 IMPLANT
GLOVE BIO SURGEON STRL SZ8 (GLOVE) ×4 IMPLANT
GLOVE BIOGEL PI IND STRL 7.5 (GLOVE) ×2 IMPLANT
GLOVE BIOGEL PI IND STRL 8 (GLOVE) ×2 IMPLANT
GLOVE BIOGEL PI INDICATOR 7.5 (GLOVE) ×2
GLOVE BIOGEL PI INDICATOR 8 (GLOVE) ×2
GLOVE ECLIPSE 7.0 STRL STRAW (GLOVE) ×4 IMPLANT
GLOVE ORTHO TXT STRL SZ7.5 (GLOVE) ×4 IMPLANT
GOWN STRL REIN XL XLG (GOWN DISPOSABLE) ×8 IMPLANT
GOWN STRL REUS W/ TWL XL LVL3 (GOWN DISPOSABLE) ×2 IMPLANT
GOWN STRL REUS W/TWL XL LVL3 (GOWN DISPOSABLE) ×2
HANDPIECE INTERPULSE COAX TIP (DISPOSABLE) ×1
KIT BASIN OR (CUSTOM PROCEDURE TRAY) ×2 IMPLANT
KIT ROOM TURNOVER OR (KITS) ×2 IMPLANT
MANIFOLD NEPTUNE II (INSTRUMENTS) ×2 IMPLANT
NS IRRIG 1000ML POUR BTL (IV SOLUTION) ×2 IMPLANT
PACK TOTAL JOINT (CUSTOM PROCEDURE TRAY) ×2 IMPLANT
PAD ARMBOARD 7.5X6 YLW CONV (MISCELLANEOUS) ×4 IMPLANT
RTRCTR WOUND ALEXIS 18CM MED (MISCELLANEOUS) ×4
SET HNDPC FAN SPRY TIP SCT (DISPOSABLE) ×1 IMPLANT
SPONGE LAP 18X18 X RAY DECT (DISPOSABLE) ×2 IMPLANT
SPONGE LAP 4X18 X RAY DECT (DISPOSABLE) IMPLANT
STAPLER SKIN PROX WIDE 3.9 (STAPLE) ×2 IMPLANT
STAPLER VISISTAT 35W (STAPLE) ×4 IMPLANT
SUT ETHIBOND NAB CT1 #1 30IN (SUTURE) ×8 IMPLANT
SUT VIC AB 0 CT1 27 (SUTURE) ×4
SUT VIC AB 0 CT1 27XBRD ANBCTR (SUTURE) ×4 IMPLANT
SUT VIC AB 1 CT1 27 (SUTURE) ×4
SUT VIC AB 1 CT1 27XBRD ANBCTR (SUTURE) ×4 IMPLANT
SUT VIC AB 2-0 CT1 27 (SUTURE) ×4
SUT VIC AB 2-0 CT1 TAPERPNT 27 (SUTURE) ×4 IMPLANT
SUT VLOC 180 0 24IN GS25 (SUTURE) ×4 IMPLANT
TOWEL OR 17X24 6PK STRL BLUE (TOWEL DISPOSABLE) ×2 IMPLANT
TOWEL OR 17X26 10 PK STRL BLUE (TOWEL DISPOSABLE) ×2 IMPLANT
TRAY FOLEY CATH 16FRSI W/METER (SET/KITS/TRAYS/PACK) IMPLANT
WATER STERILE IRR 1000ML POUR (IV SOLUTION) ×4 IMPLANT

## 2013-10-09 NOTE — Anesthesia Preprocedure Evaluation (Addendum)
Anesthesia Evaluation  Patient identified by MRN, date of birth, ID band Patient awake    Reviewed: Allergy & Precautions, H&P , NPO status , Patient's Chart, lab work & pertinent test results  Airway       Dental   Pulmonary          Cardiovascular     Neuro/Psych    GI/Hepatic   Endo/Other    Renal/GU      Musculoskeletal   Abdominal   Peds  Hematology   Anesthesia Other Findings   Reproductive/Obstetrics                           Anesthesia Physical Anesthesia Plan  ASA: II  Anesthesia Plan: General   Post-op Pain Management:    Induction:   Airway Management Planned: Oral ETT  Additional Equipment:   Intra-op Plan:   Post-operative Plan:   Informed Consent:   Plan Discussed with:   Anesthesia Plan Comments:         Anesthesia Quick Evaluation

## 2013-10-09 NOTE — OR Nursing (Signed)
Dr Linna Caprice at bedside/ results cbc given to him h/h/plts  9.4/27.1/153

## 2013-10-09 NOTE — OR Nursing (Signed)
Dr Linna Caprice called for pacu orders/ rec'd orders for ofirmev IV  F/b dilaudid IV and also states he is otw to pacu

## 2013-10-09 NOTE — Preoperative (Signed)
Beta Blockers   Reason not to administer Beta Blockers:Not Applicable 

## 2013-10-09 NOTE — OR Nursing (Signed)
Is beginning to respond to neo gtt and addl volume from albumin and crystalloid with sbp +/- 90. Drs Linna Caprice and Oletta Lamas maintaining close oversight of pt's status

## 2013-10-09 NOTE — Progress Notes (Signed)
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

## 2013-10-09 NOTE — Transfer of Care (Signed)
Immediate Anesthesia Transfer of Care Note  Patient: Kelsey Hazard, MD  Procedure(s) Performed: Procedure(s): BILATERAL ANTERIOR TOTAL HIP ARTHROPLASTY (Bilateral)  Patient Location: PACU  Anesthesia Type:General  Level of Consciousness: awake and patient cooperative  Airway & Oxygen Therapy: Patient Spontanous Breathing and Patient connected to nasal cannula oxygen  Post-op Assessment: Report given to PACU RN, Post -op Vital signs reviewed and stable, Patient moving all extremities and complaining of "tight" feeling in hips  Post vital signs: Reviewed and stable  Complications: No apparent anesthesia complications and extra medications for pain and nausea

## 2013-10-09 NOTE — H&P (Signed)
TOTAL HIP ADMISSION H&P  Patient is admitted for bilaterally total hip arthroplasty.  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  Onset of symptoms was abrupt starting 1 years ago with rapidlly worsening course since that time.The patient noted no past surgery on the bilaterally hip(s).  Patient currently rates pain in the bilaterally hip at 10 out of 10 with activity. Patient has night pain, worsening of pain with activity and weight bearing, pain that interfers with activities of daily living, pain with passive range of motion and joint swelling. Patient has evidence of subchondral cysts, subchondral sclerosis, periarticular osteophytes and joint space narrowing by imaging studies. This condition presents safety issues increasing the risk of falls.  There is no current active infection.  Patient Active Problem List   Diagnosis Date Noted  . Degenerative arthritis of bilateral hips 10/09/2013  . Femoral acetabular impingement 06/05/2013  . Acetabular labrum tear 06/05/2013  . Pain in joint, lower leg 02/21/2013  . Leg weakness, bilateral 02/19/2013  . S/P right inguinal hernia repair 10/06/2012  . Gallstones-asymptomatic 07/14/2012  . Diverticulosis of colon 07/14/2012   Past Medical History  Diagnosis Date  . GERD (gastroesophageal reflux disease)   . Hyperlipidemia   . Thyroid disease   . Hypothyroidism   . Hypercholesteremia   . Leg pain     weakness, generalized  . Osteoarthritis     rt hip    Past Surgical History  Procedure Laterality Date  . Dilation and curettage of uterus  1989    for miscarriage  . Inguinal hernia repair  09/01/2012    Procedure: HERNIA REPAIR INGUINAL ADULT;   Surgeon: Pedro Earls, MD;  Location: [facility];  Service: General;  Laterality: Right;  laparoscopic assisted  . Laparoscopic abdominal exploration  09/01/2012    Procedure: LAPAROSCOPIC ABDOMINAL EXPLORATION;  Surgeon: Pedro Earls, MD;  Location: [facility];  Service: General;  Laterality: N/A;  . Spigelian hernia  09/01/2012    Procedure: SPIGELIAN HERNIA;  Surgeon: Pedro Earls, MD;  Location: [facility];  Service: General;  Laterality: Right;  laparoscopic assisted  . Insertion of mesh  09/01/2012    Procedure: INSERTION OF MESH;  Surgeon: Pedro Earls, MD;  Location: [facility];  Service: General;  Laterality: Right;  spigelian and inguinal  . Colonoscopy      Prescriptions prior to admission  Medication Sig Dispense Refill  . levothyroxine (SYNTHROID, LEVOTHROID) 50 MCG tablet Take 50 mcg by mouth daily before breakfast.        No Known Allergies  History  Substance Use Topics  . Smoking status: Never Smoker   . Smokeless tobacco: Never Used  . Alcohol Use: Yes     Comment: 1-2 glasses of wine per month    Family History  Problem Relation Age of Onset  . Cancer Mother     breast  . Stroke Mother   . Diabetes Father   . Multiple sclerosis Sister   . Multiple sclerosis Maternal Aunt      Review of Systems  Musculoskeletal: Positive for joint pain.  All other systems reviewed and are negative.    Objective:  Physical Exam  Constitutional: She is oriented to person, place, and time. She appears well-developed and  well-nourished.  HENT:  Head: Normocephalic and atraumatic.  Eyes: EOM are normal. Pupils are equal, round, and reactive to light.  Neck: Normal range of motion. Neck supple.  Cardiovascular: Normal rate and regular rhythm.   Respiratory: Effort normal and breath sounds normal.  GI: Soft. Bowel sounds are normal.  Musculoskeletal:       Right hip: She exhibits decreased range of motion, decreased strength and bony tenderness.       Left hip: She  exhibits decreased range of motion, decreased strength and bony tenderness.  Neurological: She is alert and oriented to person, place, and time.  Skin: Skin is warm and dry.  Psychiatric: She has a normal mood and affect.    Vital signs in last 24 hours: Temp:  [97.7 F (36.5 C)] 97.7 F (36.5 C) (01/27 1000) Pulse Rate:  [73] 73 (01/27 1000) Resp:  [16] 16 (01/27 1000) BP: (129)/(87) 129/87 mmHg (01/27 1000) SpO2:  [98 %] 98 % (01/27 1000)  Labs:   Estimated body mass index is 22.61 kg/(m^2) as calculated from the following:   Height as of 09/28/13: 5' 1.5" (1.562 m).   Weight as of 08/17/13: 55.157 kg (121 lb 9.6 oz).   Imaging Review Plain radiographs demonstrate severe degenerative joint disease of the bilateral hip(s). The bone quality appears to be good for age and reported activity level.  Assessment/Plan:  End stage arthritis, bilaterally hip(s)  The patient history, physical examination, clinical judgement of the provider and imaging studies are consistent with end stage degenerative joint disease of the bilaterally hip(s) and total hip arthroplasty is deemed medically necessary. The treatment options including medical management, injection therapy, arthroscopy and arthroplasty were discussed at length. The risks and benefits of total hip arthroplasty were presented and reviewed. The risks due to aseptic loosening, infection, stiffness, dislocation/subluxation,  thromboembolic complications and other imponderables were discussed.  The patient acknowledged the explanation, agreed to proceed with the plan and consent was signed. Patient is being admitted for inpatient treatment for surgery, pain control, PT, OT, prophylactic antibiotics, VTE prophylaxis, progressive ambulation and ADL's and discharge planning.The patient is planning to be discharged to skilled nursing facility

## 2013-10-09 NOTE — Anesthesia Procedure Notes (Signed)
Procedure Name: Intubation Date/Time: 10/09/2013 12:24 PM Performed by: Williemae Area B Pre-anesthesia Checklist: Patient identified, Emergency Drugs available, Suction available and Patient being monitored Patient Re-evaluated:Patient Re-evaluated prior to inductionOxygen Delivery Method: Circle system utilized Preoxygenation: Pre-oxygenation with 100% oxygen Intubation Type: IV induction Ventilation: Mask ventilation without difficulty Laryngoscope Size: Mac and 3 Grade View: Grade I Tube type: Oral Tube size: 7.5 mm Number of attempts: 1 Airway Equipment and Method: Stylet Placement Confirmation: ETT inserted through vocal cords under direct vision,  breath sounds checked- equal and bilateral and positive ETCO2 Secured at: 21 (cm at teeth) cm Tube secured with: Tape Dental Injury: Teeth and Oropharynx as per pre-operative assessment

## 2013-10-09 NOTE — OR Nursing (Signed)
Attempts to wean from SM to [place] unsuccessful, drops sats to mid 80's despite her being arousable and following commands

## 2013-10-09 NOTE — Anesthesia Postprocedure Evaluation (Signed)
  Anesthesia Post-op Note  Patient: Kelsey Hazard, MD  Procedure(s) Performed: Procedure(s): BILATERAL ANTERIOR TOTAL HIP ARTHROPLASTY (Bilateral)  Patient Location: PACU  Anesthesia Type:General  Level of Consciousness: awake, alert  and oriented  Airway and Oxygen Therapy: Patient Spontanous Breathing and Patient connected to nasal cannula oxygen  Post-op Pain: mild  Post-op Assessment: Post-op Vital signs reviewed, Patient's Cardiovascular Status Stable, Respiratory Function Stable and Patent Airway  Post-op Vital Signs: stable  Complications: No apparent anesthesia complications

## 2013-10-09 NOTE — Brief Op Note (Signed)
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  PRE-OPERATIVE DIAGNOSIS:  Severe arthritis bilateral hips  POST-OPERATIVE DIAGNOSIS:  Severe arthritis bilateral hips  PROCEDURE:  Procedure(s): BILATERAL ANTERIOR TOTAL HIP ARTHROPLASTY (Bilateral)  SURGEON:  Surgeon(s) and Role:    * Mcarthur Rossetti, MD - Primary  PHYSICIAN ASSISTANT: Benita Stabile, PA-C  ANESTHESIA:   general  EBL:  Total I/O In: 2900 [I.V.:2150; IV Piggyback:750] Out: 5188 [Urine:100; Blood:1250]  BLOOD ADMINISTERED:none  DRAINS: none   LOCAL MEDICATIONS USED:  NONE  SPECIMEN:  No Specimen  DISPOSITION OF SPECIMEN:  N/A  COUNTS:  YES  TOURNIQUET:  * No tourniquets in log *  DICTATION: .Other Dictation: Dictation Number [id number]  PLAN OF CARE: Admit to inpatient   PATIENT DISPOSITION:  PACU - hemodynamically stable.   Delay start of Pharmacological VTE agent (>24hrs) due to surgical blood loss or risk of bleeding: no

## 2013-10-10 LAB — POCT I-STAT 4, (NA,K, GLUC, HGB,HCT)
GLUCOSE: 128 mg/dL — AB (ref 70–99)
GLUCOSE: 113 mg/dL — AB (ref 70–99)
HEMATOCRIT: 29 % — AB (ref 36.0–46.0)
HEMATOCRIT: 19 % — AB (ref 36.0–46.0)
HEMOGLOBIN: 9.9 g/dL — AB (ref 12.0–15.0)
Hemoglobin: 6.5 g/dL — CL (ref 12.0–15.0)
POTASSIUM: 3.8 meq/L (ref 3.7–5.3)
POTASSIUM: 3.8 meq/L (ref 3.7–5.3)
Sodium: 138 mEq/L (ref 137–147)
Sodium: 139 mEq/L (ref 137–147)

## 2013-10-10 LAB — CBC
HCT: 30.8 % — ABNORMAL LOW (ref 36.0–46.0)
HEMOGLOBIN: 11 g/dL — AB (ref 12.0–15.0)
MCH: 30.3 pg (ref 26.0–34.0)
MCHC: 35.7 g/dL (ref 30.0–36.0)
MCV: 84.8 fL (ref 78.0–100.0)
Platelets: 164 10*3/uL (ref 150–400)
RBC: 3.63 MIL/uL — AB (ref 3.87–5.11)
RDW: 15.5 % (ref 11.5–15.5)
WBC: 6.8 10*3/uL (ref 4.0–10.5)

## 2013-10-10 LAB — BASIC METABOLIC PANEL
BUN: 10 mg/dL (ref 6–23)
CHLORIDE: 100 meq/L (ref 96–112)
CO2: 27 meq/L (ref 19–32)
Calcium: 8.5 mg/dL (ref 8.4–10.5)
Creatinine, Ser: 0.63 mg/dL (ref 0.50–1.10)
GFR calc Af Amer: >90 mL/min (ref >90)
GFR calc non Af Amer: >90 mL/min (ref >90)
GLUCOSE: 121 mg/dL — AB (ref 70–99)
POTASSIUM: 3.9 meq/L (ref 3.7–5.3)
SODIUM: 139 meq/L (ref 137–147)

## 2013-10-10 NOTE — Progress Notes (Signed)
Clinical Social Work Department BRIEF PSYCHOSOCIAL ASSESSMENT 10/10/2013  Patient:  Kelsey Bradford, Kelsey Bradford     Account Number:  [id number]     Admit date:  10/09/2013  Clinical Social Worker:  Su Monks  Date/Time:  10/10/2013 02:27 PM  Referred by:  Physician  Date Referred:  10/09/2013 Referred for  SNF Placement   Other Referral:   Interview type:  Patient Other interview type:    PSYCHOSOCIAL DATA Living Status:  ALONE Admitted from facility:   Level of care:   Primary support name:  Kelsey Bradford [phone] Primary support relationship to patient:  [place] Degree of support available:   Good per patient but states that brother does not live locally.    CURRENT CONCERNS Current Concerns  Post-Acute Placement   Other Concerns:    SOCIAL WORK ASSESSMENT / PLAN CSW met with patient to discuss plans after discharge. CSW explained role, patient agreeable to speaking. CSW explained PT's recommendation of SNF rehab. Patient agreeable, states that she was referred to [facility] by MD. CSW explained process of intiating SNF search by entire county. Patient gave permission to fax out to [facility]. Patient is not interested in [facility], as her mother passed away at that facility and it holds tough memories. Patient has no further questions at this time, and thanked CSW for assistance.   Assessment/plan status:  Information/Referral to [employer] Other assessment/ plan:   Information/referral to community resources:   CSW provided patient with SNF listing and will intiate SNF search in [place]    PATIENT'S/FAMILY'S RESPONSE TO PLAN OF CARE: When asked how she felt about SNF rehab, patient replied "I need to go. I'm worried about my mobility." CSW provided emotional support and emphasized that SNF rehab ideally should provide intensive PT. Patient states preference for [facility].    Tilden Fossa, MSW, [place] Clinical Social Worker [facility]  Emergency Dept. [phone]

## 2013-10-10 NOTE — Progress Notes (Signed)
Clinical Social Work Department CLINICAL SOCIAL WORK PLACEMENT NOTE 10/10/2013  Patient:  Kelsey Bradford, Kelsey Bradford  Account Number:  [id number] [place] date:  10/09/2013  Clinical Social Worker:  Tilden Fossa, Latanya Presser  Date/time:  10/10/2013 03:02 PM  Clinical Social Work is seeking post-discharge placement for this patient at the following level of care:   SKILLED NURSING   (*CSW will update this form in Epic as items are completed)   10/10/2013  Patient/family provided with [facility] Department of Clinical Social Work's list of facilities offering this level of care within the geographic area requested by the patient (or if unable, by the patient's family).  10/10/2013  Patient/family informed of their freedom to choose among providers that offer the needed level of care, that participate in Medicare, Medicaid or managed care program needed by the patient, have an available bed and are willing to accept the patient.    Patient/family informed of [facility]' ownership interest in [facility], as well as of the fact that they are under no obligation to receive care at this facility.  PASARR submitted to EDS on 10/10/2013 PASARR number received from EDS on 10/10/2013  FL2 transmitted to all facilities in geographic area requested by pt/family on  10/10/2013 FL2 transmitted to all facilities within larger geographic area on   Patient informed that his/her managed care company has contracts with or will negotiate with  certain facilities, including the following:     Patient/family informed of bed offers received:   Patient chooses bed at  Physician recommends and patient chooses bed at    Patient to be transferred to  on   Patient to be transferred to facility by   The following physician request were entered in Epic:   Additional Comments:  Tilden Fossa, MSW, [place] Worker [facility] Emergency Dept. [phone]

## 2013-10-10 NOTE — Progress Notes (Signed)
Subjective: 1 Day Post-Op Procedure(s) (LRB): BILATERAL ANTERIOR TOTAL HIP ARTHROPLASTY (Bilateral) Patient reports pain as moderate.  Much more pain on the right side than left.  Labs not back yet, but anticipate acute blood loss anemia from surgery.  Needs to keep foley in today to monitor UOP.  Objective: Vital signs in last 24 hours: Temp:  [95.5 F (35.3 C)-98.5 F (36.9 C)] 98.5 F (36.9 C) (01/28 0519) Pulse Rate:  [52-107] 107 (01/28 0519) Resp:  [8-18] 16 (01/28 0519) BP: (88-147)/(50-97) 112/52 mmHg (01/28 0519) SpO2:  [91 %-100 %] 96 % (01/28 0519) Weight:  [53.978 kg (119 lb)] 53.978 kg (119 lb) (01/27 1500)  Intake/Output from previous day: 01/27 0701 - 01/28 0700 In: 6700 [P.O.:840; I.V.:4460; Blood:650; IV Piggyback:750] Out: 2660 [Urine:1410; Blood:1250] Intake/Output this shift:     Recent Labs  10/09/13 1630  HGB 9.4*    Recent Labs  10/09/13 1630  WBC 7.9  RBC 3.12*  HCT 27.1*  PLT 153   No results found for this basename: NA, K, CL, CO2, BUN, CREATININE, GLUCOSE, CALCIUM,  in the last 72 hours No results found for this basename: LABPT, INR,  in the last 72 hours  Sensation intact distally Intact pulses distally Dorsiflexion/Plantar flexion intact Incision: scant drainage  Assessment/Plan: 1 Day Post-Op Procedure(s) (LRB): BILATERAL ANTERIOR TOTAL HIP ARTHROPLASTY (Bilateral) Up with therapy  Marise Knapper Y 10/10/2013, 7:28 AM

## 2013-10-10 NOTE — Progress Notes (Signed)
Dr. Ninfa Linden stated that he wanted pts foley to stay in place to strictly monitor I/O.

## 2013-10-10 NOTE — Op Note (Signed)
Kelsey Bradford, Kelsey Bradford NO.:  [id number]  MEDICAL RECORD NO.:  [id number]  LOCATION:  [id number]                        FACILITY:  [facility]  PHYSICIAN:  Lind Guest. Ninfa Linden, M.D.DATE OF BIRTH:  1951-05-05  DATE OF PROCEDURE:  10/09/2013 DATE OF DISCHARGE:                              OPERATIVE REPORT   PREOPERATIVE DIAGNOSIS:  Severe end-stage arthritis and degenerative joint disease, bilateral hips.  POSTOPERATIVE DIAGNOSIS:  Severe end-stage arthritis and degenerative joint disease, bilateral hips.  PROCEDURE:  Bilateral total hip arthroplasty through direct anterior approach.  IMPLANTS: 1. Right hip.  DePuy Gription Sector acetabular component size 48 with     apex hole eliminator guide and a single screw, size 32+ 4 neutral     polyethylene liner, size 10 Corail femoral component with standard     offset, size 32+ 1 ceramic hip ball. 2. Left hip.  DePuy Sector Gription acetabular component size 48 with     apex hole eliminator guide and a single screw, size 32+ 4 neutral     polyethylene liner, size 10 Corail femoral component with standard     offset, size 32+ 5 ceramic hip ball.  SURGEON:  Lind Guest. Ninfa Linden, M.D.  ASSISTING:  Erskine Emery, PA-C.  ANESTHESIA:  General.  ANTIBIOTICS:  2 g IV Ancef.  BLOOD LOSS:  750 mL on the right hip and 150 mL on the left hip.  OTHER MEDICATIONS GIVEN:  Tranexamic acid.  COMPLICATIONS:  None.  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  Her right hip is definitely much worse than her left hip.  Both show significant radiographic evidence of end-stage arthritis with subchondral sclerosis, cystic changes, complete loss of the joint space and periarticular osteophytes.  Given her debilitating pain on both hips, she wishes to proceed at this point with a bilateral total hip arthroplasties.  I have talked her about the risks of  this given her age but she is very young, 85.  She is very healthy as well and understands that we will proceed with a right hip and if things will go along, we can then return to the left hip.  We will give her tranexamic acid to help with blood loss as well.  She starting off with a good hemoglobin as well.  She understands the risks of acute blood loss anemia, nerve and vessel injury, the need for transfusion, fracture, infection and DVT.  She understands the goals are decreased pain, improved mobility, and overall improved quality of life.  PROCEDURE DESCRIPTION:  After informed consent was obtained and appropriate right and left hips were marked, she was brought to the operating room and general anesthesia was obtained while she was on a stretcher.  A Foley catheter was placed and then traction boots were placed on both her feet.  Next, she was placed supine on the Hana fracture table, the perineal post in place and both legs in inline skeletal traction devices but no traction applied.  Her hips were then assessed fluoroscopically so we could obtain her hip center for judging leg lengths.  We then prepped the first hip  which was the right hip with DuraPrep and sterile drapes.  Time-out was called to identify the correct patient and correct right hip.  We then made an incision inferior and posterior to the anterosuperior iliac spine and carried this obliquely down the leg.  We dissected down to the tensor fascia lata muscle and then divided the tensor fascia longitudinally and proceeded with a direct anterior approach to the hip.  We were able to easily cauterize the lateral femoral circumflex vessels and placed Cobra retractor around the lateral neck and up underneath the rectus femoris. Cobra retractor was placed medially.  I opened up the hip capsule in a L- type format and found a very large joint effusion and significant arthritic changes of the femoral head.  We then made our  femoral neck cut just proximal to the lesser trochanter using an oscillating saw and completed this with an osteotome.  We then placed a corkscrew guide in the femoral head and removed the femoral head in its entirety.  We did encounter bleeding in the acetabulum and I cauterized the bleeding and was able to make control of this.  We then began reaming from a size 42 reamer and 2 mm increments up to a size 48 with all reamers under direct visualization, and the last reamer under direct fluoroscopy so we could obtain our depth of reamer inclination and anteversion.  Once I was pleased with this, I placed the real DePuy Sector Gription acetabular component size 48 with the apex hole eliminator guide and a single screw.  I then placed the real 32+ 4 neutral polyethylene liner. Attention was then turned to the femur.  With the leg externally rotated to 100 degrees, extended and adducted, we were able to use a box cutting osteotome to enter the femoral canal and a rongeur to lateralize.  We placed a Mueller retractor medially and a Hohmann retractor behind the greater trochanter.  I released the lateral joint capsule.  I then broached from a size 8, broached up to a size 10 broach using the Corail broaching system.  We then trialed a standard neck and a 32+ 1 hip ball, rolled the leg back over up with traction and internal rotation, reduced to the pelvis and it was stable.  We then assessed under fluoroscopy and we are pleased with this as well.  We then dislocated the hip and removed the trial components.  We placed the real Corail femoral component size 10 with standard offset, the real 32+ 1 ceramic hip ball, reduced this back in the acetabulum.  It was again stable.  We copiously irrigated the soft tissue with normal saline solution, and using pulsatile lavage and closed the joint capsule with interrupted #1 Ethibond suture followed by running #1 Vicryl in the tensor fascia, 0 Vicryl in  the deep tissue, 2-0 Vicryl in the subcutaneous tissue, and a 4-0 Monocryl subcuticular stitch.  An Aquacel dressing was applied.  An intraoperative hemoglobin was assessed and it showed her hemoglobin to be 9.9, and she was stable, otherwise, it was felt that we could proceed to the left side.  The left side was then prepped and draped with DuraPrep and sterile drapes, and we all have changed our gown and gloves.  We went to the opposite side, and then I made an incision just distal and inferior to the anterosuperior iliac spine.  We dissected down the tensor fascia lata muscle and tensor fascia was divided longitudinally.  We then proceeded with a direct  anterior approach to the hip.  We cauterized the lateral femoral circumflex vessels on the left side, and then placed Cobra retractors on the medial and lateral neck.  We opened up the joint capsule and found a large effusion on the left side as well as significant arthritic changes.  We made our femoral neck cut on the left side now with an oscillating saw and completed this with an osteotome.  We placed a corkscrew guide in the femoral head and removed the femoral head in its entirety and found to be devoid of cartilage.  We then cleaned the acetabulum debris and began reaming from a size 42 reamer all the way up to a size 48 with all reamers under direct visualization, the last reamer under direct fluoroscopy.  We were able then to place a real 48 DePuy Sector Gription acetabular component with apex hole eliminator guide, a single screw and the 32+ 4 neutral polyethylene liner for a size 48 cup.  Attention was then turned to the femur with the leg externally rotated, extended and adducted.  We were able to place a Mueller retractor medially and a Hohmann retractor behind the greater trochanter.  We released the lateral joint capsule and used a box cutting osteotome to enter femoral canal and rongeur to lateralize.  We then broached up  to a size 10 broach using the Corail broaching system.  We trialed a 32+ 1 femoral head and reduced this in the acetabulum.  I felt like she was just a little bit short, so we trialed up to a size 32+ 5 head and brought her leg lengths equal.  We then dislocated the hip and removed the trial components and placed the real Corail femoral component size 10 with standard offset and the real 32+ 5 ceramic hip ball, reduced this back in the acetabulum and it was stable.  We copiously irrigated the soft tissue on this side and found that we did not have much blood loss on this side.  We closed the joint capsule with interrupted #1 Ethibond suture followed by a running #1 Vicryl in the tensor fascia, 0 Vicryl in the deep tissue, 2-0 Vicryl in the subcutaneous tissue, 4-0 Monocryl subcuticular stitch, Steri-Strips and Aquacel dressing.  She was then taken off the Hana table, awakened, extubated, taken to recovery room in stable condition.  All final counts were correct.  There were no complications noted.  Of note, Erskine Emery, PA-C was present for the entire case and his presence was crucial for getting this case facilitating and completed.     Lind Guest. Ninfa Linden, M.D.     CYB/MEDQ  D:  10/09/2013  T:  10/10/2013  Job:  [id number]

## 2013-10-10 NOTE — Care Management Note (Signed)
CARE MANAGEMENT NOTE 10/10/2013  Patient:  Kelsey Bradford, Kelsey Bradford   Account Number:  [id number]  Date Initiated:  10/10/2013  Documentation initiated by:  Ricki Miller  Subjective/Objective Assessment:   63 yr old female s/p bilateral hip arthroplasties.     Action/Plan:   Case Manager spoke with patient. She states she wants to go to [facility] for shortterm rehab. Social worker notified.   Anticipated DC Date:  10/12/2013   Anticipated DC Plan:  SKILLED NURSING FACILITY  In-house referral  Clinical Social Worker      DC Planning Services  CM consult      [facility] Choice  NA   Choice offered to / List presented to:             Status of service:  Completed, signed off Medicare Important Message given?   (If response is "NO", the following Medicare IM given date fields will be blank) Date Medicare IM given:   Date Additional Medicare IM given:    Discharge Disposition:  [place]

## 2013-10-10 NOTE — Progress Notes (Signed)
Rehab Admissions Coordinator Note:  Patient was screened by Cleatrice Burke for appropriateness for an Inpatient Acute Rehab Consult.  At this time, we are recommending Inpatient Rehab consult.  Cleatrice Burke 10/10/2013, 1:33 PM  I can be reached at [phone].

## 2013-10-10 NOTE — Progress Notes (Signed)
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    PT Comments   Pt continues to be very anxious throughout session, and the anticipation of pain appears to be limiting her mobility. Pt was premedicated prior to therapy, and requested IV medication before attempting to move the LE's at all. The pt responds well to calm, specific commands. Feel that pt is ready for ambulation, however only performing SPT at this time. Overall, good rehab effort and pt reports willingness to work with PT to get better.   Follow Up Recommendations  CIR     Does the patient have the potential to tolerate intense rehabilitation     Barriers to Discharge        Equipment Recommendations  Other (comment) (TBD by next venue of care)    Recommendations for Other Services    Frequency 7X/week   Progress towards PT Goals Progress towards PT goals: Progressing toward goals  Plan Current plan remains appropriate    Precautions / Restrictions Precautions Precautions: Fall Restrictions Weight Bearing Restrictions: Yes RLE Weight Bearing: Weight bearing as tolerated LLE Weight Bearing: Weight bearing as tolerated Other Position/Activity Restrictions: Educated pt on trying to keep LE's in a neutral position while resting. Significant bilateral knee valgus and internal rotation noted.    Pertinent Vitals/Pain Pain control continues to be an issue - coordinated pain medication with RN and received IV pain medication during session.     Mobility  Bed Mobility Overal bed mobility: Needs Assistance;+2 for physical assistance Bed Mobility: Sit to Supine Sit to supine: Max assist;+2 for physical assistance General bed mobility comments: +2 required  to return to supine as well as transfer towards HOB.  Transfers Overall transfer level: Needs assistance Equipment used: Rolling walker (2 wheeled) Transfers: Sit to/from [facility] Sit to Stand: Max assist;+2 physical assistance Stand pivot transfers: Max assist;+2 physical assistance General transfer comment: VC's for hand placement on seated surface for safety. Assist to power up to full standing, and cues to brace herself on the walker by straightening her arms. Pt states her arms are weak and she is unable to hold herself up. With +2 assist, pt is able to take small side steps to transition from chair to bed.    Exercises Total Joint Exercises Heel Slides: 10 reps;Right;Left Hip ABduction/ADduction: 10 reps;Right;Left   PT Diagnosis:    PT Problem List:   PT Treatment Interventions:     PT Goals (current goals can now be found in the care plan section) Acute Rehab PT Goals Patient Stated Goal: To return to independence PT Goal Formulation: With patient Time For Goal Achievement: 10/24/13 Potential to Achieve Goals: Good  Visit Information  Last PT Received On: 10/10/13 Assistance Needed: +2 History of Present Illness: Pt is a 63 y/o female admitted s/p bilateral THA's direct anterior approach.     Subjective Data  Subjective: "I just don't know what to do." Patient Stated Goal: To return to independence   Cognition  Cognition Arousal/Alertness: Awake/alert Behavior During Therapy: Anxious Overall Cognitive Status: Within Functional Limits for tasks assessed    Balance  Balance Overall balance assessment: Needs assistance Sitting-balance support: Feet supported;Bilateral upper extremity supported Sitting balance-Leahy Scale: Poor Standing balance support: Bilateral upper extremity supported Standing  balance-Leahy Scale: Poor  End of Session PT - End of Session Equipment Utilized During Treatment: Gait belt Activity Tolerance: Patient limited by  pain Patient left: in chair;with call bell/phone within reach Nurse Communication: Mobility status   GP     Jolyn Lent 10/10/2013, 4:20 PM  Jolyn Lent, [place], DPT [phone]

## 2013-10-10 NOTE — Progress Notes (Signed)
UR COMPLETED.  Guss Bunde RN CCM Case Mgmt

## 2013-10-10 NOTE — Evaluation (Signed)
Physical Therapy Evaluation Patient Details Name: Kelsey POEHLMAN, MD MRN: [id number] DOB: 1951/08/13 Today's Date: 10/10/2013 Time: 0940-1000 PT Time Calculation (min): 20 min  PT Assessment / Plan / Recommendation History of Present Illness  Pt is a 63 y/o female admitted s/p bilateral THA's direct anterior approach.   Clinical Impression  This patient presents with acute pain and decreased functional independence following the above mentioned procedure. At the time of PT eval, pt in increased amount of pain which limited her functional mobility. Coordinated pain meds with RN prior to session. Large amount of anxiety for movement and pain which also limited pt this session. This patient is appropriate for skilled PT interventions to address functional limitations, improve safety and independence with functional mobility, and return to PLOF.    PT Assessment  Patient needs continued PT services    Follow Up Recommendations  CIR    Does the patient have the potential to tolerate intense rehabilitation      Barriers to Discharge        Equipment Recommendations  Other (comment) (TBD by next venue of care)    Recommendations for Other Services     Frequency 7X/week    Precautions / Restrictions Precautions Precautions: Fall Restrictions Weight Bearing Restrictions: Yes RLE Weight Bearing: Weight bearing as tolerated LLE Weight Bearing: Weight bearing as tolerated Other Position/Activity Restrictions: Educated pt on trying to keep LE's in a neutral position while resting. Significant bilateral knee valgus and internal rotation noted.    Pertinent Vitals/Pain Pt in 10/10 pain during transfer to EOB. Pt positioned for comfort in recliner at end of session and RN was notified.       Mobility  Bed Mobility Overal bed mobility: Needs Assistance;+2 for physical assistance Bed Mobility: Supine to Sit Supine to sit: Total assist;+2 for physical assistance General bed mobility  comments: Pt with significant pain increase with any lateral movement of LE's. Pt states her hips "spasm". +2 assist with bed pad use required to transition pt to EOB.  Transfers Overall transfer level: Needs assistance Equipment used: Rolling walker (2 wheeled) Transfers: Sit to/from [facility] Sit to Stand: Max assist;+2 physical assistance Stand pivot transfers: Max assist;+2 physical assistance General transfer comment: VC's for hand placement on seated surface for safety. Assist to power up to full standing, and cues to brace herself on the walker by straightening her arms. Pt states her arms are weak and she is unable to hold herself up. With +2 assist, pt is able to take small side steps to transition from bed to chair. Recliner pulled up behind her for safety as she began to sit without warning.     Exercises Total Joint Exercises Ankle Circles/Pumps: 10 reps Quad Sets: 10 reps Heel Slides: Other (comment) (Attempted, but unable) Hip ABduction/ADduction: Other (comment) (Attempted, but unable)   PT Diagnosis: Difficulty walking;Acute pain  PT Problem List: Decreased strength;Decreased activity tolerance;Decreased range of motion;Decreased balance;Decreased mobility;Decreased knowledge of use of DME;Decreased safety awareness;Pain;Decreased knowledge of precautions PT Treatment Interventions: DME instruction;Gait training;Stair training;Functional mobility training;Therapeutic activities;Therapeutic exercise;Neuromuscular re-education;Patient/family education     PT Goals(Current goals can be found in the care plan section) Acute Rehab PT Goals Patient Stated Goal: To return to independence PT Goal Formulation: With patient Time For Goal Achievement: 10/24/13 Potential to Achieve Goals: Good  Visit Information  Last PT Received On: 10/10/13 Assistance Needed: +2 History of Present Illness: Pt is a 63 y/o female admitted s/p bilateral THA's direct anterior  approach.  Prior Functioning  Home Living Family/patient expects to be discharged to:: Skilled nursing facility Living Arrangements: Alone Prior Function Level of Independence: Independent Comments: Employed and driving Communication Communication: No difficulties Dominant Hand: Right    Cognition  Cognition Arousal/Alertness: Awake/alert Behavior During Therapy: WFL for tasks assessed/performed Overall Cognitive Status: Within Functional Limits for tasks assessed    Extremity/Trunk Assessment Upper Extremity Assessment Upper Extremity Assessment: Generalized weakness Lower Extremity Assessment Lower Extremity Assessment: RLE deficits/detail;LLE deficits/detail RLE Deficits / Details: Decreased strength and AROM consistent with THA RLE: Unable to fully assess due to pain LLE Deficits / Details: Decreased strength and AROM consistent with THA LLE: Unable to fully assess due to pain Cervical / Trunk Assessment Cervical / Trunk Assessment: Normal   Balance Balance Overall balance assessment: Needs assistance Sitting-balance support: Feet supported;Bilateral upper extremity supported Sitting balance-Leahy Scale: Poor Postural control: Posterior lean Standing balance support: Bilateral upper extremity supported Standing balance-Leahy Scale: Poor  End of Session PT - End of Session Equipment Utilized During Treatment: Gait belt Activity Tolerance: Patient limited by pain Patient left: in chair;with call bell/phone within reach Nurse Communication: Mobility status  GP     Jolyn Lent 10/10/2013, 12:36 PM  Jolyn Lent, [place], DPT [phone]

## 2013-10-11 ENCOUNTER — Encounter (HOSPITAL_COMMUNITY): Payer: Self-pay

## 2013-10-11 LAB — CBC
HCT: 26.2 % — ABNORMAL LOW (ref 36.0–46.0)
Hemoglobin: 9.2 g/dL — ABNORMAL LOW (ref 12.0–15.0)
MCH: 30.3 pg (ref 26.0–34.0)
MCHC: 35.1 g/dL (ref 30.0–36.0)
MCV: 86.2 fL (ref 78.0–100.0)
PLATELETS: 141 10*3/uL — AB (ref 150–400)
RBC: 3.04 MIL/uL — ABNORMAL LOW (ref 3.87–5.11)
RDW: 15.2 % (ref 11.5–15.5)
WBC: 8.6 10*3/uL (ref 4.0–10.5)

## 2013-10-11 MED ORDER — CYCLOBENZAPRINE HCL 10 MG PO TABS
10.0000 mg | ORAL_TABLET | Freq: Three times a day (TID) | ORAL | Status: DC
  Administered 2013-10-11 – 2013-10-13 (×4): 10 mg via ORAL
  Filled 2013-10-11 (×7): qty 1

## 2013-10-11 NOTE — Progress Notes (Signed)
Subjective: 2 Days Post-Op Procedure(s) (LRB): BILATERAL ANTERIOR TOTAL HIP ARTHROPLASTY (Bilateral) Patient reports pain as moderate.  Much more difficulty on right hip limiting mobility.  Hgb down to 9.2.  God UOP.  Objective: Vital signs in last 24 hours: Temp:  [98.9 F (37.2 C)-99.8 F (37.7 C)] 98.9 F (37.2 C) (01/29 0627) Pulse Rate:  [86-98] 98 (01/29 0627) Resp:  [15-18] 18 (01/29 0627) BP: (95-120)/(46-56) 120/56 mmHg (01/29 0627) SpO2:  [96 %-100 %] 99 % (01/29 0627)  Intake/Output from previous day: 01/28 0701 - 01/29 0700 In: 2330 [P.O.:330; I.V.:2000] Out: 2000 [Urine:2000] Intake/Output this shift:     Recent Labs  10/09/13 1505 10/09/13 1513 10/09/13 1630 10/10/13 0645 10/11/13 0428  HGB 4.1* 6.5* 9.4* 11.0* 9.2*    Recent Labs  10/10/13 0645 10/11/13 0428  WBC 6.8 8.6  RBC 3.63* 3.04*  HCT 30.8* 26.2*  PLT 164 141*    Recent Labs  10/09/13 1513 10/10/13 0645  NA 139 139  K 3.8 3.9  CL  --  100  CO2  --  27  BUN  --  10  CREATININE  --  0.63  GLUCOSE 113* 121*  CALCIUM  --  8.5   No results found for this basename: LABPT, INR,  in the last 72 hours  Sensation intact distally Intact pulses distally Dorsiflexion/Plantar flexion intact Incision: scant drainage  Assessment/Plan: 2 Days Post-Op Procedure(s) (LRB): BILATERAL ANTERIOR TOTAL HIP ARTHROPLASTY (Bilateral) Up with therapy Discharge to SNF in next 1-2 days Monitor hgb. D/C foley  BLACKMAN,CHRISTOPHER Y 10/11/2013, 7:02 AM

## 2013-10-11 NOTE — Progress Notes (Signed)
Physical Therapy Treatment Patient Details Name: Kelsey BACKSTROM, MD MRN: [id number] DOB: 12-20-1950 Today's Date: 10/11/2013 Time: 7106-2694 PT Time Calculation (min): 35 min  PT Assessment / Plan / Recommendation  History of Present Illness Pt is a 63 y/o female admitted s/p bilateral THA's direct anterior approach.    PT Comments   Pt progressing slowly towards physical therapy goals. Pt requests and receives IV pain meds prior to session beginning, however continues to have difficulty with small amounts of mobility due to pain. Therapeutic exercise was very slow, requiring therapist to cue pt for each initiation and release of an exercise. Pt also required cueing to keep eyes open and for pursed-lip breathing throughout session.   Follow Up Recommendations  CIR     Does the patient have the potential to tolerate intense rehabilitation     Barriers to Discharge        Equipment Recommendations  Other (comment)    Recommendations for Other Services    Frequency 7X/week   Progress towards PT Goals Progress towards PT goals: Progressing toward goals  Plan Current plan remains appropriate    Precautions / Restrictions Precautions Precautions: Fall Restrictions Weight Bearing Restrictions: No RLE Weight Bearing: Weight bearing as tolerated LLE Weight Bearing: Weight bearing as tolerated   Pertinent Vitals/Pain Pt reports significant pain on the R>L, especially during transfers and weight bearing. On RA, pt's O2 saturation decreased to 86% when she would start to fall asleep during exercise, and would increase into the mid-90's once cued to open eyes and perform pursed-lip breathing.     Mobility  Bed Mobility Overal bed mobility: Needs Assistance;+2 for physical assistance Bed Mobility: Supine to Sit Supine to sit: Total assist;HOB elevated;+2 for physical assistance Sit to supine: Max assist;+2 for physical assistance General bed mobility comments: +2 required for supine to  sit, with specific instructions required for LE movement as well as upper body positioning while transferring to EOB. Transfers Overall transfer level: Needs assistance Equipment used: Rolling walker (2 wheeled) Transfers: Sit to/from [facility] Sit to Stand: Max assist;+2 physical assistance Stand pivot transfers: Mod assist;+2 physical assistance General transfer comment: VC's for hand placement on seated surface for safety. Assist to power up to full standing, and cues to brace herself on the walker by straightening her arms. Pt states her arms are weak and she is unable to hold herself up. With +2 assist, pt is able to take small front and then backward steps to transition from bed to chair    Exercises Total Joint Exercises Ankle Circles/Pumps: 10 reps Quad Sets: 10 reps Hip ABduction/ADduction: 10 reps Long Arc Quad: 10 reps   PT Diagnosis:    PT Problem List:   PT Treatment Interventions:     PT Goals (current goals can now be found in the care plan section) Acute Rehab PT Goals Patient Stated Goal: to go to rehab and then home/back to work PT Goal Formulation: With patient Time For Goal Achievement: 10/24/13 Potential to Achieve Goals: Good  Visit Information  Last PT Received On: 10/11/13 Assistance Needed: +2 PT/OT/SLP Co-Evaluation/Treatment: Yes Reason for Co-Treatment: Complexity of the patient's impairments (multi-system involvement) PT goals addressed during session: Mobility/safety with mobility;Proper use of DME;Strengthening/ROM OT goals addressed during session: ADL's and self-care History of Present Illness: Pt is a 63 y/o female admitted s/p bilateral THA's direct anterior approach.     Subjective Data  Subjective: "I don't know how I got myself into this." Patient Stated Goal: to  go to rehab and then home/back to work   Cognition  Cognition Arousal/Alertness: Awake/alert Behavior During Therapy: Anxious Overall Cognitive Status: Within  Functional Limits for tasks assessed    Balance  Balance Overall balance assessment: Needs assistance Sitting-balance support: Feet supported;Bilateral upper extremity supported Sitting balance-Leahy Scale: Fair Postural control: Posterior lean Standing balance support: Bilateral upper extremity supported Standing balance-Leahy Scale: Poor  End of Session PT - End of Session Equipment Utilized During Treatment: Gait belt Activity Tolerance: Patient limited by pain Patient left: in chair;with call bell/phone within reach Nurse Communication: Mobility status   GP     Jolyn Lent 10/11/2013, 3:48 PM  Jolyn Lent, [place], DPT [phone]

## 2013-10-11 NOTE — Progress Notes (Signed)
Went to remove foley; patient would like foley removed this morning after physical therapy.  Will pass on to day shift to f/u.

## 2013-10-11 NOTE — Progress Notes (Signed)
MD, please address foley.  Day shift yesterday stated that we were to keep foley in for I/O as per MD.

## 2013-10-11 NOTE — Discharge Instructions (Signed)
Information on my medicine - XARELTO® (Rivaroxaban) ° °This medication education was reviewed with me or my healthcare representative as part of my discharge preparation.  The pharmacist that spoke with me during my hospital stay was:  Cartha Rotert G, RPH ° °Why was Xarelto® prescribed for you? °Xarelto® was prescribed for you to reduce the risk of blood clots forming after orthopedic surgery. The medical term for these abnormal blood clots is venous thromboembolism (VTE). ° °What do you need to know about xarelto® ? °Take your Xarelto® ONCE DAILY at the same time every day. °You may take it either with or without food. ° °If you have difficulty swallowing the tablet whole, you may crush it and mix in applesauce just prior to taking your dose. ° °Take Xarelto® exactly as prescribed by your doctor and DO NOT stop taking Xarelto® without talking to the doctor who prescribed the medication.  Stopping without other VTE prevention medication to take the place of Xarelto® may increase your risk of developing a clot. ° °After discharge, you should have regular check-up appointments with your healthcare provider that is prescribing your Xarelto®.   ° °What do you do if you miss a dose? °If you miss a dose, take it as soon as you remember on the same day then continue your regularly scheduled once daily regimen the next day. Do not take two doses of Xarelto® on the same day.  ° °Important Safety Information °A possible side effect of Xarelto® is bleeding. You should call your healthcare provider right away if you experience any of the following: °? Bleeding from an injury or your nose that does not stop. °? Unusual colored urine (red or dark brown) or unusual colored stools (red or black). °? Unusual bruising for unknown reasons. °? A serious fall or if you hit your head (even if there is no bleeding). ° °Some medicines may interact with Xarelto® and might increase your risk of bleeding while on Xarelto®. To help  avoid this, consult your healthcare provider or pharmacist prior to using any new prescription or non-prescription medications, including herbals, vitamins, non-steroidal anti-inflammatory drugs (NSAIDs) and supplements. ° °This website has more information on Xarelto®: www.xarelto.com. ° ° ° °

## 2013-10-11 NOTE — Evaluation (Signed)
Occupational Therapy Evaluation and Discharge Patient Details Name: Kelsey LAPINSKI, MD MRN: [id number] DOB: 10/01/1950 Today's Date: 10/11/2013 Time: 5329-9242 OT Time Calculation (min): 26 min  OT Assessment / Plan / Recommendation History of present illness Pt is a 63 y/o female admitted s/p bilateral THA's direct anterior approach.    Clinical Impression   This 63 yo female admitted and underwent above presents to acute OT with decreased movement Bil LEs, increased pain Bil LEs, decreased strength Bil UEs, decreased mobility all affecting pt's ability to care for herself. Pt will benefit continued OT at [facility]. Acute OT will sign off.    OT Assessment  All further OT needs can be met in the next venue of care    Follow Up Recommendations  SNF       Equipment Recommendations   (TBD at next venue)          Precautions / Restrictions Precautions Precautions: Fall Restrictions Weight Bearing Restrictions: No RLE Weight Bearing: Weight bearing as tolerated LLE Weight Bearing: Weight bearing as tolerated Other Position/Activity Restrictions: Educated pt on trying to keep LE's in a neutral position while resting. Significant bilateral knee valgus and internal rotation noted. Pt states that PTA she tended to not be able to put her right heel down   Pertinent Vitals/Pain 6/10 FACES; Bil LEs; repositioned and pre-medicated with IV pain meds by RN    ADL  Eating/Feeding: Independent Where Assessed - Eating/Feeding: Chair Grooming: Set up Where Assessed - Grooming: Supported sitting Upper Body Bathing: Set up Where Assessed - Upper Body Bathing: Supported sitting Lower Body Bathing: +1 Total assistance Where Assessed - Lower Body Bathing: Supported sit to stand (+2) Upper Body Dressing: Set up Where Assessed - Upper Body Dressing: Supported sitting Lower Body Dressing: +1 Total assistance Where Assessed - Lower Body Dressing: Supported sit to stand (+2) Toilet Transfer: +2 Total  assistance Toilet Transfer: Patient Percentage: 50% Toilet Transfer Method: Sit to stand;Stand pivot Science writer:  (Bed>recliner going to pt;s right) Toileting - Water quality scientist and Hygiene: +1 Total assistance Where Assessed - Toileting Clothing Manipulation and Hygiene: Sit to stand from 3-in-1 or toilet Equipment Used: Gait belt;Rolling walker Transfers/Ambulation Related to ADLs: see mobility section    OT Diagnosis: Generalized weakness;Acute pain  OT Problem List: Decreased strength;Decreased range of motion;Decreased activity tolerance;Impaired balance (sitting and/or standing);Pain;Decreased knowledge of use of DME or AE (Bil UE weakness)    Acute Rehab OT Goals Patient Stated Goal: to go to rehab and then home/back to work OT Goal Formulation: With patient  Visit Information  Last OT Received On: 10/11/13 Assistance Needed: +2 PT/OT/SLP Co-Evaluation/Treatment: Yes (partial) Reason for Co-Treatment: Complexity of the patient's impairments (multi-system involvement) OT goals addressed during session: ADL's and self-care History of Present Illness: Pt is a 63 y/o female admitted s/p bilateral THA's direct anterior approach.        Prior [place] expects to be discharged to:: Skilled nursing facility Living Arrangements: Alone Prior Function Level of Independence: Independent Comments: Employed--Dermotologist  and driving Communication Communication: No difficulties Dominant Hand: Right         Vision/Perception Vision - History Patient Visual Report: No change from baseline   Cognition  Cognition Arousal/Alertness: Awake/alert Behavior During Therapy: Anxious Overall Cognitive Status: Within Functional Limits for tasks assessed    Extremity/Trunk Assessment Upper Extremity Assessment Upper Extremity Assessment: Generalized weakness     Mobility Bed Mobility Overal bed mobility: +2 for physical  assistance;Needs Assistance Bed Mobility: Supine to Sit Supine to sit: Total assist;HOB elevated;+2 for physical assistance Transfers Overall transfer level: Needs assistance Equipment used: Rolling walker (2 wheeled) Transfers: Sit to/from Stand;Stand Pivot Transfers Sit to Stand: +2 physical assistance;Max assist Stand pivot transfers: Mod assist;+2 physical assistance General transfer comment: VC's for hand placement on seated surface for safety. Assist to power up to full standing, and cues to brace herself on the walker by straightening her arms. Pt states her arms are weak and she is unable to hold herself up. With +2 assist, pt is able to take small front and then backward steps to transition from bed to chair        Balance Balance Overall balance assessment: Needs assistance Sitting-balance support: Feet supported Sitting balance-Leahy Scale: Fair Standing balance support: Bilateral upper extremity supported Standing balance-Leahy Scale: Poor   End of Session OT - End of Session Equipment Utilized During Treatment: Gait belt;Rolling walker Activity Tolerance: Patient limited by pain Patient left: in chair (with PT doing exercises with her) Nurse Communication: Mobility status (and NT: +2 with gait belt stand pivot)       Almon Register  [phone]  10/11/2013, 11:59 AM

## 2013-10-12 LAB — CBC
HCT: 25.2 % — ABNORMAL LOW (ref 36.0–46.0)
Hemoglobin: 8.8 g/dL — ABNORMAL LOW (ref 12.0–15.0)
MCH: 30 pg (ref 26.0–34.0)
MCHC: 34.9 g/dL (ref 30.0–36.0)
MCV: 86 fL (ref 78.0–100.0)
Platelets: 150 10*3/uL (ref 150–400)
RBC: 2.93 MIL/uL — ABNORMAL LOW (ref 3.87–5.11)
RDW: 14.6 % (ref 11.5–15.5)
WBC: 7.8 10*3/uL (ref 4.0–10.5)

## 2013-10-12 NOTE — Progress Notes (Signed)
Subjective: 3 Days Post-Op Procedure(s) (LRB): BILATERAL ANTERIOR TOTAL HIP ARTHROPLASTY (Bilateral) Patient reports pain as moderate.  However, still difficulty with spasms with the right hip.  Objective: Vital signs in last 24 hours: Temp:  [98.4 F (36.9 C)-99 F (37.2 C)] 98.4 F (36.9 C) (01/29 2135) Pulse Rate:  [86-104] 86 (01/29 2135) Resp:  [18] 18 (01/29 2135) BP: (112-119)/(65-67) 112/65 mmHg (01/29 2135) SpO2:  [99 %-100 %] 100 % (01/29 2135) Weight:  [53.978 kg (119 lb)] 53.978 kg (119 lb) (01/29 0900)  Intake/Output from previous day: 01/29 0701 - 01/30 0700 In: 480 [P.O.:480] Out: 2350 [Urine:2350] Intake/Output this shift:     Recent Labs  10/09/13 1513 10/09/13 1630 10/10/13 0645 10/11/13 0428 10/12/13 0505  HGB 6.5* 9.4* 11.0* 9.2* 8.8*    Recent Labs  10/11/13 0428 10/12/13 0505  WBC 8.6 7.8  RBC 3.04* 2.93*  HCT 26.2* 25.2*  PLT 141* 150    Recent Labs  10/09/13 1513 10/10/13 0645  NA 139 139  K 3.8 3.9  CL  --  100  CO2  --  27  BUN  --  10  CREATININE  --  0.63  GLUCOSE 113* 121*  CALCIUM  --  8.5   No results found for this basename: LABPT, INR,  in the last 72 hours  Sensation intact distally Intact pulses distally Dorsiflexion/Plantar flexion intact Incision: scant drainage  Assessment/Plan: 3 Days Post-Op Procedure(s) (LRB): BILATERAL ANTERIOR TOTAL HIP ARTHROPLASTY (Bilateral) Up with therapy Change to flexeril for spasms Needs another 1-2 days here before d/c to [facility] Y 10/12/2013, 7:01 AM

## 2013-10-12 NOTE — Progress Notes (Signed)
Patient will not discharge until Monday per Md note.

## 2013-10-12 NOTE — Progress Notes (Signed)
Physical Therapy Treatment Patient Details Name: Kelsey RENAUD, MD MRN: [id number] DOB: 05-08-1951 Today's Date: 10/12/2013 Time: 6270-3500 PT Time Calculation (min): 30 min  PT Assessment / Plan / Recommendation  History of Present Illness Pt is a 63 y/o female admitted s/p bilateral THA's direct anterior approach.    PT Comments   Pt making slow progress towards physical therapy goals. Able to take forward steps this session, advancing about 5 feet before requiring seated rest break. Pt with significant IR of bilateral LE's while ambulating. Continues to require assist and at times PROM during therapeutic exercise. Pt states she is d/c'ing to SNF.  Follow Up Recommendations  SNF     Does the patient have the potential to tolerate intense rehabilitation     Barriers to Discharge        Equipment Recommendations  Other (comment) (TBD by next venue of care)    Recommendations for Other Services    Frequency 7X/week   Progress towards PT Goals Progress towards PT goals: Progressing toward goals  Plan Discharge plan needs to be updated    Precautions / Restrictions Precautions Precautions: Fall Restrictions Weight Bearing Restrictions: Yes RLE Weight Bearing: Weight bearing as tolerated LLE Weight Bearing: Weight bearing as tolerated   Pertinent Vitals/Pain Pt reports she did not have all of her pain medication this morning, and she feels her pain is tolerable, given the activity.     Mobility  Bed Mobility Overal bed mobility: Needs Assistance;+2 for physical assistance Bed Mobility: Supine to Sit Supine to sit: Total assist;HOB elevated;+2 for physical assistance Sit to supine: Max assist;+2 for physical assistance General bed mobility comments: +2 required for supine to sit, with specific instructions required for LE movement as well as upper body positioning while transferring to EOB. Transfers Overall transfer level: Needs assistance Equipment used: Rolling walker (2  wheeled) Transfers: Sit to/from [facility] Sit to Stand: Max assist;+2 physical assistance Stand pivot transfers: Mod assist;+2 physical assistance General transfer comment: VC's for hand placement on seated surface for safety. Assist to power up to full standing, and cues to brace herself on the walker by straightening her arms. Pt states her arms are weak and she is unable to hold herself up. With +2 assist, pt is able to take small side steps to transition from bed to [facility]. Ambulation/Gait Ambulation/Gait assistance: Mod assist Ambulation Distance (Feet): 5 Feet Assistive device: Rolling walker (2 wheeled) Gait Pattern/deviations: Step-to pattern;Decreased stride length;Trunk flexed Gait velocity: Decreased Gait velocity interpretation: Below normal speed for age/gender General Gait Details: Mod assist for weight support during steps, as pt cannot hold her body weight with UE's on walker. VC's for sequencing.     Exercises Total Joint Exercises Quad Sets: 10 reps Hip ABduction/ADduction: 10 reps;AAROM;PROM   PT Diagnosis:    PT Problem List:   PT Treatment Interventions:     PT Goals (current goals can now be found in the care plan section) Acute Rehab PT Goals Patient Stated Goal: to go to rehab and then home/back to work PT Goal Formulation: With patient Time For Goal Achievement: 10/24/13 Potential to Achieve Goals: Good  Visit Information  Last PT Received On: 10/12/13 Assistance Needed: +2 History of Present Illness: Pt is a 63 y/o female admitted s/p bilateral THA's direct anterior approach.     Subjective Data  Subjective: "I just don't know how to do this." Patient Stated Goal: to go to rehab and then home/back to work   [employer]  Arousal/Alertness: Awake/alert Behavior During Therapy: Anxious Overall Cognitive Status: Within Functional Limits for tasks assessed    Balance  Balance Overall balance assessment: Needs  assistance Sitting-balance support: Feet supported;Bilateral upper extremity supported Sitting balance-Leahy Scale: Fair Standing balance support: Bilateral upper extremity supported Standing balance-Leahy Scale: Poor  End of Session PT - End of Session Equipment Utilized During Treatment: Gait belt Activity Tolerance: Patient limited by pain Patient left: in chair;with call bell/phone within reach Nurse Communication: Mobility status   GP     Jolyn Lent 10/12/2013, 11:24 AM  Jolyn Lent, PT, DPT [phone]

## 2013-10-12 NOTE — Progress Notes (Signed)
Physical Therapy Treatment Patient Details Name: Kelsey HESTER, MD MRN: [id number] DOB: 11-Jun-1951 Today's Date: 10/12/2013 Time: 6283-1517 PT Time Calculation (min): 23 min  PT Assessment / Plan / Recommendation  History of Present Illness Pt is a 63 y/o female admitted s/p bilateral THA's direct anterior approach.    PT Comments   Pt very fatigued and lethargic during session, which pt reports is from pain medication. Although anxious about movement, pt showing improvement with sit>stand transfers and transition from chair to bed.   Follow Up Recommendations  SNF     Does the patient have the potential to tolerate intense rehabilitation     Barriers to Discharge        Equipment Recommendations  Other (comment) (TBD by next venue of care)    Recommendations for Other Services    Frequency 7X/week   Progress towards PT Goals Progress towards PT goals: Progressing toward goals  Plan Current plan remains appropriate    Precautions / Restrictions Precautions Precautions: Fall Restrictions Weight Bearing Restrictions: Yes RLE Weight Bearing: Weight bearing as tolerated LLE Weight Bearing: Weight bearing as tolerated Other Position/Activity Restrictions: Educated pt on trying to keep LE's in a neutral position while resting. Significant bilateral knee valgus and internal rotation noted. Pt states that PTA she tended to not be able to put her right heel down   Pertinent Vitals/Pain Pt reports moderate pain throughout session with movement. RN offers pain medication and pt declines, stating she is already too sleepy.    Mobility  Bed Mobility Overal bed mobility: Needs Assistance Bed Mobility: Sit to Supine Sit to supine: Mod assist General bed mobility comments: Mod A required for LE elevation back to bed. Pt used trapeze to straighten herself out and slide up in the bed after supine.  Transfers Overall transfer level: Needs assistance Equipment used: Rolling walker (2  wheeled) Transfers: Sit to/from [facility] Sit to Stand: Max assist Stand pivot transfers: Mod assist General transfer comment: VC's for hand placement on seated surface for safety. Assist to power up to full standing, and cues to brace herself on the walker by straightening her arms. Pt states her arms are weak and she is unable to hold herself up. With mod assist, pt is able to take small side steps to transition from chair to bed.    Exercises Total Joint Exercises Hip ABduction/ADduction: 10 reps Long Arc Quad: 10 reps   PT Diagnosis:    PT Problem List:   PT Treatment Interventions:     PT Goals (current goals can now be found in the care plan section) Acute Rehab PT Goals Patient Stated Goal: to go to rehab and then home/back to work PT Goal Formulation: With patient Time For Goal Achievement: 10/24/13 Potential to Achieve Goals: Good  Visit Information  Last PT Received On: 10/12/13 Assistance Needed: +2 History of Present Illness: Pt is a 63 y/o female admitted s/p bilateral THA's direct anterior approach.     Subjective Data  Subjective: "I'm so sleepy." Patient Stated Goal: to go to rehab and then home/back to work   Cognition  Cognition Arousal/Alertness: Awake/alert Behavior During Therapy: Anxious Overall Cognitive Status: Within Functional Limits for tasks assessed    Balance     End of Session PT - End of Session Equipment Utilized During Treatment: Gait belt Activity Tolerance: Patient limited by pain Patient left: in chair;with call bell/phone within reach Nurse Communication: Mobility status   GP     Jolyn Lent 10/12/2013,  4:14 PM  Jolyn Lent, [place], DPT [phone]

## 2013-10-13 LAB — TYPE AND SCREEN
ABO/RH(D): A POS
Antibody Screen: NEGATIVE
UNIT DIVISION: 0
Unit division: 0
Unit division: 0
Unit division: 0

## 2013-10-13 LAB — CBC
HCT: 25 % — ABNORMAL LOW (ref 36.0–46.0)
HEMOGLOBIN: 8.7 g/dL — AB (ref 12.0–15.0)
MCH: 30.1 pg (ref 26.0–34.0)
MCHC: 34.8 g/dL (ref 30.0–36.0)
MCV: 86.5 fL (ref 78.0–100.0)
PLATELETS: 225 10*3/uL (ref 150–400)
RBC: 2.89 MIL/uL — AB (ref 3.87–5.11)
RDW: 14.5 % (ref 11.5–15.5)
WBC: 9.1 10*3/uL (ref 4.0–10.5)

## 2013-10-13 LAB — BASIC METABOLIC PANEL
BUN: 16 mg/dL (ref 6–23)
CHLORIDE: 96 meq/L (ref 96–112)
CO2: 27 mEq/L (ref 19–32)
Calcium: 8 mg/dL — ABNORMAL LOW (ref 8.4–10.5)
Creatinine, Ser: 0.58 mg/dL (ref 0.50–1.10)
GFR calc Af Amer: >90 mL/min (ref >90)
GFR calc non Af Amer: >90 mL/min (ref >90)
Glucose, Bld: 105 mg/dL — ABNORMAL HIGH (ref 70–99)
Potassium: 3.3 mEq/L — ABNORMAL LOW (ref 3.7–5.3)
SODIUM: 135 meq/L — AB (ref 137–147)

## 2013-10-13 MED ORDER — METHOCARBAMOL 500 MG PO TABS
500.0000 mg | ORAL_TABLET | Freq: Four times a day (QID) | ORAL | Status: DC | PRN
  Administered 2013-10-14 (×2): 500 mg via ORAL
  Filled 2013-10-13 (×2): qty 1

## 2013-10-13 MED ORDER — NALOXONE HCL 0.4 MG/ML IJ SOLN
INTRAMUSCULAR | Status: AC
  Administered 2013-10-13: 0.4 mg
  Filled 2013-10-13: qty 1

## 2013-10-13 NOTE — Progress Notes (Signed)
   Subjective:  Patient reports pain as mild.  No events  Objective:   VITALS:   Filed Vitals:   10/11/13 2135 10/12/13 1447 10/12/13 2015 10/13/13 0529  BP: 112/65 125/67 114/59 115/60  Pulse: 86 100 104 108  Temp: 98.4 F (36.9 C) 97.8 F (36.6 C) 98.1 F (36.7 C) 97.9 F (36.6 C)  TempSrc:   Oral   Resp: 18 18 18 18   Height:      Weight:      SpO2: 100% 94% 94% 95%    Neurologically intact Neurovascular intact Sensation intact distally Intact pulses distally Dorsiflexion/Plantar flexion intact Incision: dressing C/D/I and no drainage No cellulitis present Compartment soft   Lab Results  Component Value Date   WBC 7.8 10/12/2013   HGB 8.8* 10/12/2013   HCT 25.2* 10/12/2013   MCV 86.0 10/12/2013   PLT 150 10/12/2013     Assessment/Plan: 4 Days Post-Op   Problem List Items Addressed This Visit   None       Up with PT/OT DVT ppx - SCDs, ambulation, xarelto WBAT bilateral lower extremity Pain control Discharge planning - likely SNF monday   Marianna Payment 10/13/2013, 7:55 AM [phone]

## 2013-10-13 NOTE — Significant Event (Signed)
Rapid Response Event Note  Overview: Time Called: 1120 Arrival Time: 1123 Event Type: Other (Comment)  Initial Focused Assessment: Called by primary RN to assess patient for increased lethargy.  Upon arrival to patients room, Rn at bedsdie.  Patient is lying in bed on nasal cannula.  Patietn is arousable to voice but quickly drifts back to sleep.  Patient is oriented.  Patient has been receiving flexeril 10mg  scheduled as well as oxy prn.   Interventions:  0.4 mg narcan IV given.  Patient slightly more awake but still drifts off quick.  VSS, HR 135, SPO2 94%.  Recommend to call MD   Event Summary:   at      at          [facility]

## 2013-10-13 NOTE — Progress Notes (Signed)
Patient was lethargic but arousable. Patient was given oxycodone around 0500 and again at 10:15. Patient also has been getting flexeril 10 mg q 3 times daily. Patient would open her eyes only to fall quickly back to sleep. PT attempted to get her up but she was unable to get the patient to keep her eyes open. Eyes were pin point but responded to light. Called rapid response and gave patient 0.4 narcan. Patient was a little bit awake but mostly lethargic. Dr. Ninfa Linden made aware. Dr. Ninfa Linden d/cd the flexeril and placed patient on robaxin. Patient is a lot more awake and can talk and make conversation with you. Patient was upset that she had missed PT today and explained to her that we tried to get her up but she was too sleepy to respond. Will continue to monitor.

## 2013-10-13 NOTE — Progress Notes (Signed)
Physical Therapy Treatment Patient Details Name: ARLETHIA BASSO, MD MRN: [id number] DOB: 01-08-1951 Today's Date: 10/13/2013 Time: 0160-1093 PT Time Calculation (min): 19 min  PT Assessment / Plan / Recommendation  History of Present Illness Pt is a 63 y/o female admitted s/p bilateral THA's direct anterior approach.    PT Comments   Pt very lethargic this session. Nursing in room attempting to give pt scheduled medication. Pt unable to stay awake lying in bed for >10 seconds to take pill. RN assisted with sitting pt to edge of bed. At edge of bed 12-15 minutes working on arousal with pt concurrently with nursing. BP stable (see vital signs flowsheet). HR 134-136 at edge of bed and SaO2 88-89% on RA. Charge RN into room during this and applied 3 lpm oxygen which did incr pt's SaO2 to >93%. Pt arouses to name some, sternal rub/external stimulus consistently. Pt assisted back to lying with 3 person total assist and positioned in bed with head elevated. Pt left with nursing to continue monitoring and primary RN calling rapid response RN/MD. Will continue acute PT as pt is able to participate with therapies. Will follow up tomorrow.   Follow Up Recommendations  SNF     Equipment Recommendations  None recommended by PT    Frequency 7X/week   Progress towards PT Goals Progress towards PT goals: Not progressing toward goals - comment (too lethargic this session.)  Plan Current plan remains appropriate    Precautions / Restrictions Precautions Precautions: Fall Restrictions RLE Weight Bearing: Weight bearing as tolerated Other Position/Activity Restrictions: Educated pt on trying to keep LE's in a neutral position while resting. Significant bilateral knee valgus and internal rotation noted. Pt states that PTA she tended to not be able to put her right heel down.   Pertinent Vitals/Pain See flow sheet area for vitals charged by nursing during session.    Mobility  Bed Mobility Overal bed  mobility: +2 for physical assistance;Needs Assistance Bed Mobility: Supine to Sit;Sit to Supine Supine to sit: +2 for physical assistance;Total assist;HOB elevated Sit to supine: +2 for physical assistance;Total assist General bed mobility comments: 2 person max to total assist for supine to sit and 3 person total assist for sit to supine. Sat edge of bed 12-15 minutes working with nursing to arouse pt. (see PT session comments).      PT Goals (current goals can now be found in the care plan section) Acute Rehab PT Goals Patient Stated Goal: to go to rehab and then home/back to work PT Goal Formulation: With patient Time For Goal Achievement: 10/24/13 Potential to Achieve Goals: Good  Visit Information  Last PT Received On: 10/13/13 Assistance Needed: +2 History of Present Illness: Pt is a 63 y/o female admitted s/p bilateral THA's direct anterior approach.     Subjective Data  Patient Stated Goal: to go to rehab and then home/back to work   Cognition  Cognition Arousal/Alertness: [employer];Suspect due to medications Overall Cognitive Status: Difficult to assess Difficult to assess due to: Level of arousal    End of Session PT - End of Session Activity Tolerance: Treatment limited secondary to medical complications (Comment);Patient limited by lethargy Patient left: in bed;with call bell/phone within reach;with nursing/sitter in room Nurse Communication: Mobility status   GP     Willow Ora 10/13/2013, 11:33 AM  Willow Ora, PTA Office- [phone]

## 2013-10-14 NOTE — Progress Notes (Signed)
Physical Therapy Treatment Patient Details Name: Kelsey MONGIELLO, MD MRN: [id number] DOB: [birth year] Today's Date: 10/14/2013 Time: 2595-6387 PT Time Calculation (min): 25 min  PT Assessment / Plan / Recommendation  History of Present Illness Pt is a 63 y/o female admitted s/p bilateral THA's direct anterior approach.    PT Comments   Pt more alert today and able to participate in therapy. Incr activity today and less overall assistance needed with mobility. Progressing well, however will still benefit from snf rehab before discharge home alone.   Follow Up Recommendations  SNF     Equipment Recommendations  None recommended by PT    Frequency 7X/week   Progress towards PT Goals Progress towards PT goals: Progressing toward goals  Plan Current plan remains appropriate    Precautions / Restrictions Precautions Precautions: Fall Restrictions RLE Weight Bearing: Weight bearing as tolerated LLE Weight Bearing: Weight bearing as tolerated Other Position/Activity Restrictions: Educated pt on trying to keep LE's in a neutral position while resting. Significant bilateral knee valgus and internal rotation noted. Pt states that PTA she tended to not be able to put her right heel down.    Mobility  Transfers Overall transfer level: Needs assistance Equipment used: Rolling walker (2 wheeled) Transfers: Sit to/from Stand Sit to Stand: Min assist;+2 physical assistance General transfer comment: cues for hand placement and technique to stand up from recliner chair and bsc. cues to use arms to control descent with sitting down. Ambulation/Gait Ambulation/Gait assistance: Min assist;+2 safety/equipment Ambulation Distance (Feet): 15 Feet Assistive device: Rolling walker (2 wheeled) Gait Pattern/deviations: Step-through pattern;Decreased stride length;Decreased step length - right;Decreased step length - left;Antalgic;Trunk flexed;Wide base of support Gait velocity: decreased Gait velocity  interpretation: Below normal speed for age/gender General Gait Details: cues for posture and sequence with gait. due to valgus knees, pt with wide foot placement with gait.    Exercises Total Joint Exercises Ankle Circles/Pumps: AROM;Strengthening;Both;10 reps;Seated Quad Sets: AROM;Strengthening;Both;10 reps;Seated Heel Slides: AAROM;Strengthening;Both;10 reps;Seated Hip ABduction/ADduction: AAROM;Strengthening;Both;10 reps;Seated     PT Goals (current goals can now be found in the care plan section) Acute Rehab PT Goals Patient Stated Goal: to go to rehab and then home/back to work PT Goal Formulation: With patient Time For Goal Achievement: 10/24/13 Potential to Achieve Goals: Good  Visit Information  Last PT Received On: 10/14/13 Assistance Needed: +2 History of Present Illness: Pt is a 63 y/o female admitted s/p bilateral THA's direct anterior approach.     Subjective Data  Patient Stated Goal: to go to rehab and then home/back to work   Cognition  Cognition Arousal/Alertness: Awake/alert Behavior During Therapy: WFL for tasks assessed/performed Overall Cognitive Status: Within Functional Limits for tasks assessed    End of Session PT - End of Session Equipment Utilized During Treatment: Gait belt Activity Tolerance: Patient tolerated treatment well Patient left: in chair;with call bell/phone within reach Nurse Communication: Mobility status;Patient requests pain meds   GP     Willow Ora 10/14/2013, 12:54 PM

## 2013-10-14 NOTE — Progress Notes (Signed)
Subjective:  Patient more alert.  Objective:   VITALS:   Filed Vitals:   10/13/13 2013 10/13/13 2250 10/14/13 0205 10/14/13 0449  BP: 118/67   124/67  Pulse: 115  105 115  Temp: 100.7 F (38.2 C) 98.7 F (37.1 C) 97.7 F (36.5 C) 98.3 F (36.8 C)  TempSrc: Oral Oral Oral Oral  Resp: 18   20  Height:      Weight:      SpO2: 95%  95% 100%    Neurologically intact Neurovascular intact Sensation intact distally Intact pulses distally Dorsiflexion/Plantar flexion intact Incision: dressing C/D/I and no drainage No cellulitis present Compartment soft   Lab Results  Component Value Date   WBC 9.1 10/13/2013   HGB 8.7* 10/13/2013   HCT 25.0* 10/13/2013   MCV 86.5 10/13/2013   PLT 225 10/13/2013     Assessment/Plan: 5 Days Post-Op   Problem List Items Addressed This Visit   None       Up with PT/OT DVT ppx - SCDs, ambulation, xarelto WBAT bilateral lower extremity Pain control Discharge planning - likely SNF monday   Marianna Payment 10/14/2013, 7:28 AM [phone]

## 2013-10-15 LAB — POCT I-STAT 4, (NA,K, GLUC, HGB,HCT)
Glucose, Bld: 116 mg/dL — ABNORMAL HIGH (ref 70–99)
HCT: 12 % — ABNORMAL LOW (ref 36.0–46.0)
Hemoglobin: 4.1 g/dL — CL (ref 12.0–15.0)
POTASSIUM: 8.5 meq/L — AB (ref 3.7–5.3)
SODIUM: 135 meq/L — AB (ref 137–147)

## 2013-10-15 MED ORDER — OXYCODONE-ACETAMINOPHEN 5-325 MG PO TABS: 1.0000 | ORAL_TABLET | ORAL | Status: DC | PRN

## 2013-10-15 MED ORDER — DSS 100 MG PO CAPS: 100.0000 mg | ORAL_CAPSULE | Freq: Two times a day (BID) | ORAL | Status: DC

## 2013-10-15 MED ORDER — RIVAROXABAN 10 MG PO TABS: 10.0000 mg | ORAL_TABLET | Freq: Every day | ORAL | Status: DC

## 2013-10-15 MED ORDER — METHOCARBAMOL 500 MG PO TABS: 500.0000 mg | ORAL_TABLET | Freq: Four times a day (QID) | ORAL | Status: DC | PRN

## 2013-10-15 NOTE — Discharge Summary (Signed)
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  Admit date: 10/09/2013 Discharge date: 10/15/2013  Admission Diagnoses:  Principal Problem:   Degenerative arthritis of bilateral hips Active Problems:   Status post bilateral total hip replacement   Discharge Diagnoses:  Same  Past Medical History  Diagnosis Date  . GERD (gastroesophageal reflux disease)   . Hyperlipidemia   . Thyroid disease   . Hypothyroidism   . Hypercholesteremia   . Leg pain     weakness, generalized  . 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 Patient has severe unremitting pain that affects sleep, daily activities, and work/hobbies. After pre-op clearance the patient was taken to the operating room on 10/09/2013 and underwent  Procedure(s): BILATERAL ANTERIOR TOTAL HIP ARTHROPLASTY.    Patient was given perioperative antibiotics: Anti-infectives   Start     Dose/Rate Route Frequency Ordered Stop   10/09/13 2200  ceFAZolin (ANCEF) IVPB 1 g/50 mL premix     1 g 100 mL/hr over 30 Minutes Intravenous Every 6 hours 10/09/13 1952 10/10/13 0103   10/09/13 0600  ceFAZolin (ANCEF) IVPB 2 g/50 mL premix     2 g 100 mL/hr over 30 Minutes Intravenous On call to O.R. 10/08/13 1413 10/09/13 1225       Patient was given sequential compression devices, early ambulation, and chemoprophylaxis to prevent DVT.  Patient benefited maximally from hospital stay and there were no complications.    Recent vital signs: Patient Vitals for the past 24 hrs:  BP Temp Temp src Pulse Resp SpO2  10/15/13 0631 115/60 mmHg 97.7 F (36.5 C) Oral 94 19 100 %  10/15/13 0400 - - - - 18 96 %  10/15/13 0100 - 99.1 F (37.3 C) Oral - - -  10/15/13  0000 - - - - 18 95 %  10/14/13 2039 112/65 mmHg 98 F (36.7 C) Oral 95 18 95 %  10/14/13 2000 - - - - 18 96 %  10/14/13 1600 - - - - 16 -  10/14/13 1514 110/60 mmHg 98.9 F (37.2 C) Oral 99 18 94 %  10/14/13 1200 - - - - 16 -  10/14/13 0800 - - - - 16 -     Recent laboratory studies:  Recent Labs  10/13/13 1326  WBC 9.1  HGB 8.7*  HCT 25.0*  PLT 225  NA 135*  K 3.3*  CL 96  CO2 27  BUN 16  CREATININE 0.58  GLUCOSE 105*  CALCIUM 8.0*     Discharge Medications:     Medication List         DSS 100 MG Caps  Take 100 mg by mouth 2 (two) times daily.     levothyroxine 50 MCG tablet  Commonly known as:  SYNTHROID, LEVOTHROID  Take 50 mcg by mouth daily before breakfast.     methocarbamol 500 MG tablet  Commonly known as:  ROBAXIN  Take 1 tablet (500 mg total) by mouth every 6 (six) hours as needed for muscle spasms.     oxyCODONE-acetaminophen 5-325 MG per tablet  Commonly known as:  ROXICET  Take 1-2 tablets by mouth every 4 (four) hours as needed for severe pain.     rivaroxaban 10 MG  Tabs tablet  Commonly known as:  XARELTO  Take 1 tablet (10 mg total) by mouth daily with breakfast.        Diagnostic Studies: Dg Hip Bilateral W/pelvis  10/09/2013   CLINICAL DATA:  Bilateral total hip arthroplasty.  EXAM: BILATERAL HIP WITH PELVIS - 4+ VIEW; DG C-ARM GT 120 MIN  COMPARISON:  None.  FINDINGS: Three spot fluoroscopic images demonstrate bilateral total hip arthroplasty with screw fixed acetabular components. The hardware appears well positioned. There is no evidence of acute fracture or dislocation.  IMPRESSION: No demonstrated complication following bilateral total hip arthroplasty.   Electronically Signed   By: Camie Patience M.D.   On: 10/09/2013 17:14   Dg Pelvis Portable  10/09/2013   CLINICAL DATA:  Postop bilateral hip surgery  EXAM: PORTABLE PELVIS 1-2 VIEWS  COMPARISON:  MRI 01/02/2013  FINDINGS: Surgical changes of a bilateral total hip arthroplasties. No  evidence of immediate hardware complication. The femoral head components appear located with respect to the acetabular components. The visualized bony pelvis is intact. Subcutaneous emphysema noted in the bilateral upper thighs.  IMPRESSION: Bilateral total hip arthroplasties without evidence of immediate complication.   Electronically Signed   By: Jacqulynn Cadet M.D.   On: 10/09/2013 21:34   Dg C-arm Gt 120 Min  10/09/2013   CLINICAL DATA:  Bilateral total hip arthroplasty.  EXAM: BILATERAL HIP WITH PELVIS - 4+ VIEW; DG C-ARM GT 120 MIN  COMPARISON:  None.  FINDINGS: Three spot fluoroscopic images demonstrate bilateral total hip arthroplasty with screw fixed acetabular components. The hardware appears well positioned. There is no evidence of acute fracture or dislocation.  IMPRESSION: No demonstrated complication following bilateral total hip arthroplasty.   Electronically Signed   By: Camie Patience M.D.   On: 10/09/2013 17:14    Disposition: to skilled nursing facility      Discharge Orders   Future Orders Complete By Expires   Call MD / Call 911  As directed    Comments:     If you experience chest pain or shortness of breath, CALL 911 and be transported to the hospital emergency room.  If you develope a fever above 101 F, pus (white drainage) or increased drainage or redness at the wound, or calf pain, call your surgeon's office.   Constipation Prevention  As directed    Comments:     Drink plenty of fluids.  Prune juice may be helpful.  You may use a stool softener, such as Colace (over the counter) 100 mg twice a day.  Use MiraLax (over the counter) for constipation as needed.   Diet - low sodium heart healthy  As directed    Discharge instructions  As directed    Comments:     Increase activities as comfort allows. Leave current dressing in place until her follow-up appointment. Can get current dressing wet in the shower. FULL WEIGHT BEARING BOTH HIPS AS TOLERATED; NO HIP  PRECAUTIONS.   Discharge patient  As directed    Increase activity slowly as tolerated  As directed       Follow-up Information   Follow up with Mcarthur Rossetti, MD In 1 week.   Specialty:  Orthopedic Surgery   Contact information:   [place] [address] [phone]        Signed: Mcarthur Rossetti 10/15/2013, 7:17 AM

## 2013-10-15 NOTE — Progress Notes (Signed)
Patient discharged to [facility], SNF. Report called to RN. Patients IV was removed and patient left unit in a stable condition via EMS.

## 2013-10-15 NOTE — Progress Notes (Signed)
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    PT Comments   Pt showing significant improvement with ability to perform transfers. Pt with some instability noted during ambulation requiring assist to recover, however overall pt is progressing well. She is to transfer to SNF at some point this afternoon.   Follow Up Recommendations  SNF     Does the patient have the potential to tolerate intense rehabilitation     Barriers to Discharge        Equipment Recommendations  None recommended by PT    Recommendations for Other Services    Frequency 7X/week   Progress towards PT Goals Progress towards PT goals: Progressing toward goals  Plan Current plan remains appropriate    Precautions / Restrictions Precautions Precautions: Fall Restrictions Weight Bearing Restrictions: Yes RLE Weight Bearing: Weight bearing as tolerated LLE Weight Bearing: Weight bearing as tolerated Other Position/Activity Restrictions: Educated pt on trying to keep LE's in a neutral position while resting. Significant bilateral knee valgus and internal rotation noted. Pt states that PTA she tended to not be able to put her right heel down.   Pertinent Vitals/Pain Pt reports minimal pain throughout session and states she has not had pain medication today.     Mobility  Bed Mobility Overal bed mobility: Needs Assistance Bed Mobility: Supine to Sit Supine to sit: Supervision General bed mobility comments: Pt able to transition to EOB with HOB elevated and supervision only. Pt reports minimal pain with movement.  Transfers Overall transfer level: Needs assistance Equipment used: Rolling walker (2 wheeled) Transfers: Sit to/from Stand Sit to  Stand: Min guard General transfer comment: VC's for hand placement on seated surface. Pt states "I can do it", indicating she does not want contact guard, however contact was required once at full stand due to increased unsteadiness.  Ambulation/Gait Ambulation/Gait assistance: Min assist Ambulation Distance (Feet): 20 Feet (later ambulated another 20 feet) Assistive device: Rolling walker (2 wheeled) Gait Pattern/deviations: Step-through pattern;Decreased stride length;Trunk flexed;Wide base of support Gait velocity: decreased Gait velocity interpretation: Below normal speed for age/gender General Gait Details: cues for posture and sequence with gait. due to valgus knees, pt with wide foot placement with gait.    Exercises Total Joint Exercises Ankle Circles/Pumps: 15 reps Towel Squeeze: 15 reps Hip ABduction/ADduction: 15 reps Long Arc Quad: 15 reps   PT Diagnosis:    PT Problem List:   PT Treatment Interventions:     PT Goals (current goals can now be found in the care plan section) Acute Rehab PT Goals Patient Stated Goal: to go to rehab and then home/back to work PT Goal Formulation: With patient Time For Goal Achievement: 10/24/13 Potential to Achieve Goals: Good  Visit Information  Last PT Received On: 10/15/13 Assistance Needed: +1 History of Present Illness: Pt is a 63 y/o female admitted s/p bilateral THA's direct anterior approach.     Subjective Data  Subjective: "I'm supposed to be leaving today but I don't know when." Patient Stated Goal: to go to rehab and then home/back to work   Cognition  Cognition Arousal/Alertness: Awake/alert Behavior During Therapy: WFL for tasks assessed/performed Overall Cognitive Status: Within Functional Limits for tasks assessed    Balance  Balance Overall balance assessment: Needs  assistance Standing balance support: Bilateral upper extremity supported Standing balance-Leahy Scale: Fair  End of Session PT - End of  Session Equipment Utilized During Treatment: Gait belt Activity Tolerance: Patient tolerated treatment well Patient left: in chair;with call bell/phone within reach Nurse Communication: Mobility status   GP     Jolyn Lent 10/15/2013, 3:28 PM  Jolyn Lent, [place], DPT [phone]

## 2013-10-15 NOTE — Progress Notes (Signed)
Patient ID: Molli Hazard, MD, female   DOB: [birth year], 63 y.o.   MRN: [id number] Doing much better overall.  Bending hips and knees a little better. Vitals stable.  Can discharge today to  SNF.

## 2013-10-16 ENCOUNTER — Other Ambulatory Visit: Payer: Self-pay | Admitting: *Deleted

## 2013-10-16 ENCOUNTER — Non-Acute Institutional Stay (SKILLED_NURSING_FACILITY): Payer: No Typology Code available for payment source | Admitting: Adult Health

## 2013-10-16 DIAGNOSIS — D62 Acute posthemorrhagic anemia: Secondary | ICD-10-CM

## 2013-10-16 DIAGNOSIS — K219 Gastro-esophageal reflux disease without esophagitis: Secondary | ICD-10-CM

## 2013-10-16 DIAGNOSIS — M25559 Pain in unspecified hip: Secondary | ICD-10-CM

## 2013-10-16 DIAGNOSIS — M169 Osteoarthritis of hip, unspecified: Secondary | ICD-10-CM

## 2013-10-16 DIAGNOSIS — M1610 Unilateral primary osteoarthritis, unspecified hip: Secondary | ICD-10-CM

## 2013-10-16 DIAGNOSIS — L299 Pruritus, unspecified: Secondary | ICD-10-CM

## 2013-10-16 DIAGNOSIS — E039 Hypothyroidism, unspecified: Secondary | ICD-10-CM

## 2013-10-16 MED ORDER — OXYCODONE-ACETAMINOPHEN 5-325 MG PO TABS: ORAL_TABLET | ORAL | Status: DC

## 2013-10-16 NOTE — Telephone Encounter (Signed)
[facility] 

## 2013-10-19 ENCOUNTER — Non-Acute Institutional Stay (SKILLED_NURSING_FACILITY): Payer: No Typology Code available for payment source | Admitting: Adult Health

## 2013-10-19 DIAGNOSIS — A0472 Enterocolitis due to Clostridium difficile, not specified as recurrent: Secondary | ICD-10-CM | POA: Insufficient documentation

## 2013-10-19 DIAGNOSIS — D62 Acute posthemorrhagic anemia: Secondary | ICD-10-CM | POA: Insufficient documentation

## 2013-10-19 DIAGNOSIS — L299 Pruritus, unspecified: Secondary | ICD-10-CM | POA: Insufficient documentation

## 2013-10-19 DIAGNOSIS — E039 Hypothyroidism, unspecified: Secondary | ICD-10-CM | POA: Insufficient documentation

## 2013-10-19 DIAGNOSIS — M25559 Pain in unspecified hip: Secondary | ICD-10-CM | POA: Insufficient documentation

## 2013-10-19 NOTE — Progress Notes (Addendum)
Patient ID: Kelsey Hazard, MD, female   DOB: [birth year], 63 y.o.   MRN: [id number]               PROGRESS NOTE  DATE: 10/16/2013  FACILITY: Nursing Home Location: [facility] and Rehab  LEVEL OF CARE: SNF (31)  Acute Visit  CHIEF COMPLAINT:  Follow-up hospitalization  HISTORY OF PRESENT ILLNESS: This is a 63 year old female who has been admitted to [facility] on 10/15/13 from [facility] with Degenerative arthritis S/P Bilateral total hip arthroplasty. She has been admitted for a short-term rehabilitation.  REASSESSMENT OF ONGOING PROBLEM(S):  HYPOTHYROIDISM: The hypothyroidism remains stable. No complications noted from the medications presently being used.  The patient denies fatigue or constipation.    ANEMIA: The anemia has been stable. The patient denies fatigue, melena or hematochezia. No  medications currently being used. 1/15 hgb 8.7  GERD: pt's GERD is stable.  Denies ongoing heartburn, abd. Pain, nausea or vomiting.  Currently not on any medication  PAST MEDICAL HISTORY : Reviewed.  No changes.  CURRENT MEDICATIONS: Reviewed per [facility]  REVIEW OF SYSTEMS:  GENERAL: no change in appetite, no fatigue, no weight changes, no fever, chills or weakness RESPIRATORY: no cough, SOB, DOE, wheezing, hemoptysis CARDIAC: no chest pain, edema or palpitations GI: no abdominal pain,  constipation, heart burn, nausea or vomiting, +diarrhea  PHYSICAL EXAMINATION  VS:  T97.8       P100      RR18      BP120/84         WT126 (Lb)  GENERAL: no acute distress, normal body habitus EYES: conjunctivae normal, sclerae normal, normal eye lids NECK: supple, trachea midline, no neck masses, no thyroid tenderness, no thyromegaly LYMPHATICS: no LAN in the neck, no supraclavicular LAN RESPIRATORY: breathing is even & unlabored, BS CTAB CARDIAC: RRR, no murmur,no extra heart sounds, no edema GI: abdomen soft, normal BS, no masses, no tenderness, no hepatomegaly, no  splenomegaly PSYCHIATRIC: the patient is alert & oriented to person, affect & behavior appropriate  LABS/RADIOLOGY: Labs reviewed: Basic Metabolic Panel:  Recent Labs  09/28/13 1051  10/09/13 1513 10/10/13 0645 10/13/13 1326  NA 140  < > 139 139 135*  K 4.5  < > 3.8 3.9 3.3*  CL 102  --   --  100 96  CO2 27  --   --  27 27  GLUCOSE 91  < > 113* 121* 105*  BUN 15  --   --  10 16  CREATININE 0.64  --   --  0.63 0.58  CALCIUM 9.4  --   --  8.5 8.0*  < > = values in this interval not displayed.   CBC:  Recent Labs  10/11/13 0428 10/12/13 0505 10/13/13 1326  WBC 8.6 7.8 9.1  HGB 9.2* 8.8* 8.7*  HCT 26.2* 25.2* 25.0*  MCV 86.2 86.0 86.5  PLT 141* 150 225     ASSESSMENT/PLAN:  Degenerative Arthritis S/P Bilateral Total Hip Arthroplasty - for rehabilitation  Diarrhea - stool culture for C-Difficile; change Colace to PRN  Anemia - start FeSO4 325 mg 1 PO BID  Pain - add Percocet 5/325 mg 1 tab PO Q 9AM and Robaxin 500 mg 1 tab PO Q 9AM; start Tylenol 325 mg give 2 tabs = 650 mg PO Q 4 hours PRN  Pruritus - start Benadryl 25 mg PO Q HS PRN and may repeat in 1 hour if ineffective  Hypothyroidism - continue Levothyroxine  GERD -  stable; currently not on any medications   CPT CODE: 26333

## 2013-10-19 NOTE — Progress Notes (Signed)
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   MRN: [id number]                 PROGRESS NOTE  DATE: 10/19/13  FACILITY: Nursing Home Location: [facility] and Rehab  LEVEL OF CARE: SNF (31)  Acute Visit  CHIEF COMPLAINT:  Manage Clostridium Difficile  HISTORY OF PRESENT ILLNESS: This is a 63 year old female who is having multiple episodes of diarrhea. Her stool does not have the distinct smell of C-Dif but stool culture shows trace of C-Difficile.   PAST MEDICAL HISTORY : Reviewed.  No changes.  CURRENT MEDICATIONS: Reviewed per [facility]  REVIEW OF SYSTEMS:  GENERAL: no change in appetite, no fatigue, no weight changes, no fever, chills or weakness RESPIRATORY: no cough, SOB, DOE, wheezing, hemoptysis CARDIAC: no chest pain, edema or palpitations GI: no abdominal pain, constipation, heart burn, nausea or vomiting, +diarrhea  PHYSICAL EXAMINATION  VS:  T97.8       P100      RR18      BP113/80         WT126 (Lb)  GENERAL: no acute distress, normal body habitus NECK: supple, trachea midline, no neck masses, no thyroid tenderness, no thyromegaly LYMPHATICS: no LAN in the neck, no supraclavicular LAN RESPIRATORY: breathing is even & unlabored, BS CTAB CARDIAC: RRR, no murmur,no extra heart sounds, no edema GI: abdomen soft, normal BS, no masses, no tenderness, no hepatomegaly, no splenomegaly PSYCHIATRIC: the patient is alert & oriented to person, affect & behavior appropriate  LABS/RADIOLOGY: Labs reviewed: Basic Metabolic Panel:  Recent Labs  09/28/13 1051  10/09/13 1513 10/10/13 0645 10/13/13 1326  NA 140  < > 139 139 135*  K 4.5  < > 3.8 3.9 3.3*  CL 102  --   --  100 96  CO2 27  --   --  27 27  GLUCOSE 91  < > 113* 121* 105*  BUN 15  --   --  10 16  CREATININE 0.64  --   --  0.63 0.58  CALCIUM 9.4  --   --  8.5 8.0*  < > = values in this interval not displayed.   CBC:  Recent Labs  10/11/13 0428 10/12/13 0505 10/13/13 1326  WBC  8.6 7.8 9.1  HGB 9.2* 8.8* 8.7*  HCT 26.2* 25.2* 25.0*  MCV 86.2 86.0 86.5  PLT 141* 150 225     ASSESSMENT/PLAN:  C-Difficile - start Metronidazole 500 mg 1 PO TID x 10 days  CPT CODE: 32202

## 2013-10-24 ENCOUNTER — Non-Acute Institutional Stay (SKILLED_NURSING_FACILITY): Payer: No Typology Code available for payment source | Admitting: Internal Medicine

## 2013-10-24 DIAGNOSIS — K219 Gastro-esophageal reflux disease without esophagitis: Secondary | ICD-10-CM

## 2013-10-24 DIAGNOSIS — D62 Acute posthemorrhagic anemia: Secondary | ICD-10-CM

## 2013-10-24 DIAGNOSIS — E039 Hypothyroidism, unspecified: Secondary | ICD-10-CM

## 2013-10-24 DIAGNOSIS — M1610 Unilateral primary osteoarthritis, unspecified hip: Secondary | ICD-10-CM

## 2013-10-24 DIAGNOSIS — M169 Osteoarthritis of hip, unspecified: Secondary | ICD-10-CM

## 2013-10-30 ENCOUNTER — Encounter: Payer: Self-pay | Admitting: Adult Health

## 2013-10-30 ENCOUNTER — Non-Acute Institutional Stay (SKILLED_NURSING_FACILITY): Payer: No Typology Code available for payment source | Admitting: Adult Health

## 2013-10-30 DIAGNOSIS — A0472 Enterocolitis due to Clostridium difficile, not specified as recurrent: Secondary | ICD-10-CM

## 2013-10-30 NOTE — Progress Notes (Signed)
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   MRN: [id number]                PROGRESS NOTE  DATE: 10/30/13  FACILITY: Nursing Home Location: [facility]  LEVEL OF CARE: SNF (31)  Acute Visit  CHIEF COMPLAINT:  Manage Clostridium Difficile  HISTORY OF PRESENT ILLNESS: This is a 63 year old female who is still having multiple episodes of diarrhea. Her stool does not have the distinct smell of C-difficile but has trace of C-Difficile when cultured. No complaints of abdominal pain.  PAST MEDICAL HISTORY : Reviewed.  No changes.  CURRENT MEDICATIONS: Reviewed per [facility]  REVIEW OF SYSTEMS:  GENERAL: no change in appetite, no fatigue, no weight changes, no fever, chills or weakness RESPIRATORY: no cough, SOB, DOE, wheezing, hemoptysis CARDIAC: no chest pain, edema or palpitations GI: no abdominal pain, constipation, heart burn, nausea or vomiting, +diarrhea  PHYSICAL EXAMINATION  VS:   See V/S record  GENERAL: no acute distress, normal body habitus NECK: supple, trachea midline, no neck masses, no thyroid tenderness, no thyromegaly RESPIRATORY: breathing is even & unlabored, BS CTAB CARDIAC: RRR, no murmur,no extra heart sounds, no edema GI: abdomen soft, normal BS, no masses, no tenderness, no hepatomegaly, no splenomegaly PSYCHIATRIC: the patient is alert & oriented to person, affect & behavior appropriate  LABS/RADIOLOGY: 10/25/13  Wbc 7.9  hgb 10.5  hct 34.8 10/19/13  NA 139  K 4.8  Glucose 138  BUN 10  Creatinine 0.5  CA 9.8 Labs reviewed: Basic Metabolic Panel:  Recent Labs  09/28/13 1051  10/09/13 1513 10/10/13 0645 10/13/13 1326  NA 140  < > 139 139 135*  K 4.5  < > 3.8 3.9 3.3*  CL 102  --   --  100 96  CO2 27  --   --  27 27  GLUCOSE 91  < > 113* 121* 105*  BUN 15  --   --  10 16  CREATININE 0.64  --   --  0.63 0.58  CALCIUM 9.4  --   --  8.5 8.0*  < > = values in this interval not displayed.   CBC:  Recent Labs  10/11/13 0428 10/12/13 0505 10/13/13 1326  WBC 8.6 7.8 9.1  HGB 9.2* 8.8* 8.7*  HCT 26.2* 25.2* 25.0*  MCV 86.2 86.0 86.5  PLT 141* 150 225     ASSESSMENT/PLAN:  C-Difficile - start Vancomycin 125mg  PO QID x 10 days and Florastor 250 mg PO BID x 13 days  CPT CODE: 75102

## 2013-11-02 ENCOUNTER — Non-Acute Institutional Stay (SKILLED_NURSING_FACILITY): Payer: No Typology Code available for payment source | Admitting: Adult Health

## 2013-11-02 ENCOUNTER — Encounter: Payer: Self-pay | Admitting: Adult Health

## 2013-11-02 DIAGNOSIS — E039 Hypothyroidism, unspecified: Secondary | ICD-10-CM

## 2013-11-02 DIAGNOSIS — M1610 Unilateral primary osteoarthritis, unspecified hip: Secondary | ICD-10-CM

## 2013-11-02 DIAGNOSIS — K219 Gastro-esophageal reflux disease without esophagitis: Secondary | ICD-10-CM

## 2013-11-02 DIAGNOSIS — D62 Acute posthemorrhagic anemia: Secondary | ICD-10-CM

## 2013-11-02 DIAGNOSIS — M169 Osteoarthritis of hip, unspecified: Secondary | ICD-10-CM

## 2013-11-02 DIAGNOSIS — A0472 Enterocolitis due to Clostridium difficile, not specified as recurrent: Secondary | ICD-10-CM

## 2013-11-02 NOTE — Progress Notes (Signed)
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   MRN: [id number]               PROGRESS NOTE  DATE: 11/02/13  FACILITY: Nursing Home Location: [facility] and Rehab  LEVEL OF CARE: SNF (31)  Acute Visit  CHIEF COMPLAINT:  Discharge Notes  HISTORY OF PRESENT ILLNESS: This is a 63 year old female who is for discharge home with medications and will have outpatient rehabilitation. DME: Rolling walker and 3-in-1 commode. She has been admitted to [facility] on 10/15/13 from [facility] with Degenerative arthritis S/P Bilateral total hip arthroplasty. Patient was admitted to this facility for short-term rehabilitation after the patient's recent hospitalization.  Patient has completed SNF rehabilitation and therapy has cleared the patient for discharge.  REASSESSMENT OF ONGOING PROBLEM(S):  C. Difficile COLITIS: Patient's stool tested positive for C. difficile toxin. Patient was treated with Flagyl but continues to have diarrhea. Vancomycin was started. Patient has ongoing diarrhea and abdominal pain. No complications reported from Vancomycin.  ANEMIA: The anemia has been stable. The patient denies fatigue, melena or hematochezia. No  medications currently being used. 2/15 hgb 10.5  GERD: pt's GERD is stable.  Denies ongoing heartburn, abd. Pain, nausea or vomiting.  Currently not on any medication  PAST MEDICAL HISTORY : Reviewed.  No changes.  CURRENT MEDICATIONS: Reviewed per [facility]  REVIEW OF SYSTEMS:  GENERAL: no change in appetite, no fatigue, no weight changes, no fever, chills or weakness RESPIRATORY: no cough, SOB, DOE, wheezing, hemoptysis CARDIAC: no chest pain, edema or palpitations GI: no abdominal pain,  constipation, heart burn, nausea or vomiting, +diarrhea  PHYSICAL EXAMINATION  GENERAL: no acute distress, normal body habitus EYES: conjunctivae normal, sclerae normal, normal eye lids NECK: supple, trachea midline, no neck masses, no  thyroid tenderness, no thyromegaly RESPIRATORY: breathing is even & unlabored, BS CTAB CARDIAC: RRR, no murmur,no extra heart sounds, no edema GI: abdomen soft, normal BS, no masses, no tenderness, no hepatomegaly, no splenomegaly EXTREMITIES: able to move all 4 extremities without difficulty PSYCHIATRIC: the patient is alert & oriented to person, affect & behavior appropriate  LABS/RADIOLOGY: Labs reviewed: 10/25/13  Wbc 7.9  hgb 10.5  hct 34.8    10/19/13  NA 138  K  4.8  Glucose 138  BUN 10  Creatinine 0.5  CA 9.8 10/16/13  C-Difficile toxin detected  Basic Metabolic Panel:  Recent Labs  09/28/13 1051  10/09/13 1513 10/10/13 0645 10/13/13 1326  NA 140  < > 139 139 135*  K 4.5  < > 3.8 3.9 3.3*  CL 102  --   --  100 96  CO2 27  --   --  27 27  GLUCOSE 91  < > 113* 121* 105*  BUN 15  --   --  10 16  CREATININE 0.64  --   --  0.63 0.58  CALCIUM 9.4  --   --  8.5 8.0*  < > = values in this interval not displayed.   CBC:  Recent Labs  10/11/13 0428 10/12/13 0505 10/13/13 1326  WBC 8.6 7.8 9.1  HGB 9.2* 8.8* 8.7*  HCT 26.2* 25.2* 25.0*  MCV 86.2 86.0 86.5  PLT 141* 150 225     ASSESSMENT/PLAN:  Degenerative Arthritis S/P Bilateral Total Hip Arthroplasty - for outpatient rehabilitation  C- Difficile Diarrhea - continue Vancomycin  Anemia - stable  Hypothyroidism - well-controlled  GERD - stable; currently not on any medications  I have filled out patient's discharge paperwork and written prescriptions.   DME provided: Rolling walker and 3-in-1 commode  Total discharge time: Greater than 30 minutes Discharge time involved coordination of the discharge process with Education officer, museum, nursing staff and therapy department. Medical justification for DME verified.  CPT CODE: 38756  Seth Bake  - NP [facility] [phone]

## 2013-11-03 DIAGNOSIS — K219 Gastro-esophageal reflux disease without esophagitis: Secondary | ICD-10-CM | POA: Insufficient documentation

## 2013-11-03 NOTE — Progress Notes (Signed)
HISTORY & PHYSICAL  DATE: 10/24/2013   FACILITY: [facility]  LEVEL OF CARE: SNF (31)  ALLERGIES:  No Known Allergies  CHIEF COMPLAINT:  Manage bilateral hip osteoarthritis, hypothyroidism, GERD  HISTORY OF PRESENT ILLNESS: Patient is a 63 year old Caucasian female.  HIP OSTEOARTHRITIS: patient had advanced end stage OA of the hip with progressively worsening pain & dysfunction.  Pt failed non-surgical conservative management.  Therefore pt underwent total hip arthroplasty & tolerated the procedure well.  Pt denies hip pain currently.  Pt was admitted to this facility for short term rehabilitation. Patient underwent bilateral total knee replacements.  GERD: pt's GERD is stable.  Denies ongoing heartburn, abd. Pain, nausea or vomiting.  Currently on a PPI & tolerates it without any adverse reactions.  HYPOTHYROIDISM: The hypothyroidism remains stable. No complications noted from the medications presently being used.  The patient denies fatigue or constipation.  Last TSH not available.  PAST MEDICAL HISTORY :  Past Medical History  Diagnosis Date  . GERD (gastroesophageal reflux disease)   . Hyperlipidemia   . Thyroid disease   . Hypothyroidism   . Hypercholesteremia   . Leg pain     weakness, generalized  . Osteoarthritis     rt hip    PAST SURGICAL HISTORY: Past Surgical History  Procedure Laterality Date  . Dilation and curettage of uterus  1989    for miscarriage  . Inguinal hernia repair  09/01/2012    Procedure: HERNIA REPAIR INGUINAL ADULT;  Surgeon: Pedro Earls, MD;  Location: [facility];  Service: General;  Laterality: Right;  laparoscopic assisted  . Laparoscopic abdominal exploration  09/01/2012    Procedure: LAPAROSCOPIC ABDOMINAL EXPLORATION;  Surgeon: Pedro Earls, MD;  Location: [facility];  Service: General;  Laterality: N/A;  . Spigelian hernia  09/01/2012    Procedure: SPIGELIAN HERNIA;  Surgeon: Pedro Earls, MD;   Location: [facility];  Service: General;  Laterality: Right;  laparoscopic assisted  . Insertion of mesh  09/01/2012    Procedure: INSERTION OF MESH;  Surgeon: Pedro Earls, MD;  Location: [facility];  Service: General;  Laterality: Right;  spigelian and inguinal  . Colonoscopy    . Bilateral anterior total hip arthroplasty Bilateral 10/09/2013    Procedure: BILATERAL ANTERIOR TOTAL HIP ARTHROPLASTY;  Surgeon: Mcarthur Rossetti, MD;  Location: [facility];  Service: Orthopedics;  Laterality: Bilateral;    SOCIAL HISTORY:  reports that she has never smoked. She has never used smokeless tobacco. She reports that she drinks alcohol. She reports that she does not use illicit drugs.  FAMILY HISTORY:  Family History  Problem Relation Age of Onset  . Cancer Mother     breast  . Stroke Mother   . Diabetes Father   . Multiple sclerosis Sister   . Multiple sclerosis Maternal Aunt     CURRENT MEDICATIONS: Reviewed per [facility]  REVIEW OF SYSTEMS:  See HPI otherwise 14 point ROS is negative.  PHYSICAL EXAMINATION  VS:  See VS section  GENERAL: no acute distress, normal body habitus EYES: conjunctivae normal, sclerae normal, normal eye lids MOUTH/THROAT: lips without lesions,no lesions in the mouth,tongue is without lesions,uvula elevates in midline NECK: supple, trachea midline, no neck masses, no thyroid tenderness, no thyromegaly LYMPHATICS: no LAN in the neck, no supraclavicular LAN RESPIRATORY: breathing is even & unlabored, BS CTAB CARDIAC: RRR, no murmur,no extra heart sounds, no edema GI:  ABDOMEN: abdomen soft, normal BS, no  masses, no tenderness  LIVER/SPLEEN: no hepatomegaly, no splenomegaly MUSCULOSKELETAL: HEAD: normal to inspection & palpation BACK: no kyphosis, scoliosis or spinal processes tenderness EXTREMITIES: LEFT UPPER EXTREMITY: full range of motion, normal strength & tone RIGHT UPPER EXTREMITY:  full range of motion, normal strength & tone LEFT LOWER EXTREMITY:  range of  motion not tested due to surgery, normal strength & tone RIGHT LOWER EXTREMITY:  range of motion not tested due to surgery, normal strength & tone PSYCHIATRIC: the patient is alert & oriented to person, affect & behavior appropriate  LABS/RADIOLOGY:  2-15 hemoglobin 8.2, MCV 88.4 otherwise CBC normal, glucose 138 otherwise BMP normal  Labs reviewed: Basic Metabolic Panel:  Recent Labs  09/28/13 1051  10/09/13 1513 10/10/13 0645 10/13/13 1326  NA 140  < > 139 139 135*  K 4.5  < > 3.8 3.9 3.3*  CL 102  --   --  100 96  CO2 27  --   --  27 27  GLUCOSE 91  < > 113* 121* 105*  BUN 15  --   --  10 16  CREATININE 0.64  --   --  0.63 0.58  CALCIUM 9.4  --   --  8.5 8.0*  < > = values in this interval not displayed.  CBC:  Recent Labs  10/11/13 0428 10/12/13 0505 10/13/13 1326  WBC 8.6 7.8 9.1  HGB 9.2* 8.8* 8.7*  HCT 26.2* 25.2* 25.0*  MCV 86.2 86.0 86.5  PLT 141* 150 225     BILATERAL HIP WITH PELVIS - 4+ VIEW; DG C-ARM GT 120 MIN   COMPARISON:  None.   FINDINGS: Three spot fluoroscopic images demonstrate bilateral total hip arthroplasty with screw fixed acetabular components. The hardware appears well positioned. There is no evidence of acute fracture or dislocation.   IMPRESSION: No demonstrated complication following bilateral total hip arthroplasty.   BILATERAL HIP WITH PELVIS - 4+ VIEW; DG C-ARM GT 120 MIN   COMPARISON:  None.   FINDINGS: Three spot fluoroscopic images demonstrate bilateral total hip arthroplasty with screw fixed acetabular components. The hardware appears well positioned. There is no evidence of acute fracture or dislocation.   IMPRESSION: No demonstrated complication following bilateral total hip arthroplasty.   PORTABLE PELVIS 1-2 VIEWS   COMPARISON:  MRI 01/02/2013   FINDINGS: Surgical changes of a bilateral total hip arthroplasties. No evidence of immediate hardware complication. The femoral head components appear  located with respect to the acetabular components. The visualized bony pelvis is intact. Subcutaneous emphysema noted in the bilateral upper thighs.   IMPRESSION: Bilateral total hip arthroplasties without evidence of immediate complication.    ASSESSMENT/PLAN:  Bilateral hip osteoarthritis-status post bilateral total hip replacements. Continue rehabilitation. Hypothyroidism-continue levothyroxine GERD-stable Acute blood loss anemia -hemoglobin has declined. Will monitor.  I have reviewed patient's medical records received at admission/from hospitalization.  CPT CODE: 68341  Gayani Y Dasanayaka, [place] [phone]

## 2013-12-18 DIAGNOSIS — E7800 Pure hypercholesterolemia, unspecified: Secondary | ICD-10-CM | POA: Diagnosis not present

## 2013-12-18 DIAGNOSIS — Z0000 Encounter for general adult medical examination without abnormal findings: Secondary | ICD-10-CM | POA: Diagnosis not present

## 2013-12-18 DIAGNOSIS — Z1331 Encounter for screening for depression: Secondary | ICD-10-CM | POA: Diagnosis not present

## 2013-12-18 DIAGNOSIS — D649 Anemia, unspecified: Secondary | ICD-10-CM | POA: Diagnosis not present

## 2013-12-28 DIAGNOSIS — E559 Vitamin D deficiency, unspecified: Secondary | ICD-10-CM | POA: Diagnosis not present

## 2013-12-28 DIAGNOSIS — E7800 Pure hypercholesterolemia, unspecified: Secondary | ICD-10-CM | POA: Diagnosis not present

## 2014-01-22 DIAGNOSIS — M25819 Other specified joint disorders, unspecified shoulder: Secondary | ICD-10-CM | POA: Diagnosis not present

## 2014-01-22 DIAGNOSIS — M19019 Primary osteoarthritis, unspecified shoulder: Secondary | ICD-10-CM | POA: Diagnosis not present

## 2014-05-14 ENCOUNTER — Other Ambulatory Visit: Payer: Self-pay

## 2014-05-14 DIAGNOSIS — Z1231 Encounter for screening mammogram for malignant neoplasm of breast: Secondary | ICD-10-CM

## 2014-05-27 DIAGNOSIS — M1610 Unilateral primary osteoarthritis, unspecified hip: Secondary | ICD-10-CM | POA: Diagnosis not present

## 2014-05-27 DIAGNOSIS — M25559 Pain in unspecified hip: Secondary | ICD-10-CM | POA: Diagnosis not present

## 2014-05-27 DIAGNOSIS — M169 Osteoarthritis of hip, unspecified: Secondary | ICD-10-CM | POA: Diagnosis not present

## 2014-06-07 DIAGNOSIS — Z23 Encounter for immunization: Secondary | ICD-10-CM | POA: Diagnosis not present

## 2014-06-28 DIAGNOSIS — Z23 Encounter for immunization: Secondary | ICD-10-CM | POA: Diagnosis not present

## 2014-07-05 ENCOUNTER — Ambulatory Visit
Admission: RE | Admit: 2014-07-05 | Discharge: 2014-07-05 | Disposition: A | Discharge status: Home / Self Care | Payer: Medicare Other | Source: Ambulatory Visit

## 2014-07-05 DIAGNOSIS — Z1231 Encounter for screening mammogram for malignant neoplasm of breast: Secondary | ICD-10-CM | POA: Diagnosis not present

## 2014-07-30 DIAGNOSIS — M25551 Pain in right hip: Secondary | ICD-10-CM | POA: Diagnosis not present

## 2014-07-30 DIAGNOSIS — M76892 Other specified enthesopathies of left lower limb, excluding foot: Secondary | ICD-10-CM | POA: Diagnosis not present

## 2014-08-06 DIAGNOSIS — M25552 Pain in left hip: Secondary | ICD-10-CM | POA: Diagnosis not present

## 2014-08-06 DIAGNOSIS — M7541 Impingement syndrome of right shoulder: Secondary | ICD-10-CM | POA: Diagnosis not present

## 2014-08-12 DIAGNOSIS — M25552 Pain in left hip: Secondary | ICD-10-CM | POA: Diagnosis not present

## 2014-08-12 DIAGNOSIS — M7541 Impingement syndrome of right shoulder: Secondary | ICD-10-CM | POA: Diagnosis not present

## 2014-08-16 DIAGNOSIS — M25552 Pain in left hip: Secondary | ICD-10-CM | POA: Diagnosis not present

## 2014-08-16 DIAGNOSIS — M7541 Impingement syndrome of right shoulder: Secondary | ICD-10-CM | POA: Diagnosis not present

## 2014-08-19 DIAGNOSIS — M7541 Impingement syndrome of right shoulder: Secondary | ICD-10-CM | POA: Diagnosis not present

## 2014-08-19 DIAGNOSIS — M25552 Pain in left hip: Secondary | ICD-10-CM | POA: Diagnosis not present

## 2014-08-23 DIAGNOSIS — M25552 Pain in left hip: Secondary | ICD-10-CM | POA: Diagnosis not present

## 2014-08-23 DIAGNOSIS — M7541 Impingement syndrome of right shoulder: Secondary | ICD-10-CM | POA: Diagnosis not present

## 2014-08-27 DIAGNOSIS — M25552 Pain in left hip: Secondary | ICD-10-CM | POA: Diagnosis not present

## 2014-08-27 DIAGNOSIS — M7541 Impingement syndrome of right shoulder: Secondary | ICD-10-CM | POA: Diagnosis not present

## 2014-08-30 DIAGNOSIS — M7541 Impingement syndrome of right shoulder: Secondary | ICD-10-CM | POA: Diagnosis not present

## 2014-08-30 DIAGNOSIS — M25552 Pain in left hip: Secondary | ICD-10-CM | POA: Diagnosis not present

## 2014-09-03 DIAGNOSIS — M25552 Pain in left hip: Secondary | ICD-10-CM | POA: Diagnosis not present

## 2014-09-03 DIAGNOSIS — M7541 Impingement syndrome of right shoulder: Secondary | ICD-10-CM | POA: Diagnosis not present

## 2014-09-10 DIAGNOSIS — M7541 Impingement syndrome of right shoulder: Secondary | ICD-10-CM | POA: Diagnosis not present

## 2014-09-10 DIAGNOSIS — M25552 Pain in left hip: Secondary | ICD-10-CM | POA: Diagnosis not present

## 2014-12-24 DIAGNOSIS — Z23 Encounter for immunization: Secondary | ICD-10-CM | POA: Diagnosis not present

## 2014-12-24 DIAGNOSIS — E780 Pure hypercholesterolemia: Secondary | ICD-10-CM | POA: Diagnosis not present

## 2014-12-24 DIAGNOSIS — E039 Hypothyroidism, unspecified: Secondary | ICD-10-CM | POA: Diagnosis not present

## 2014-12-24 DIAGNOSIS — D649 Anemia, unspecified: Secondary | ICD-10-CM | POA: Diagnosis not present

## 2014-12-24 DIAGNOSIS — K219 Gastro-esophageal reflux disease without esophagitis: Secondary | ICD-10-CM | POA: Diagnosis not present

## 2015-01-24 DIAGNOSIS — H00032 Abscess of right lower eyelid: Secondary | ICD-10-CM | POA: Diagnosis not present

## 2015-01-28 DIAGNOSIS — E780 Pure hypercholesterolemia: Secondary | ICD-10-CM | POA: Diagnosis not present

## 2015-02-14 DIAGNOSIS — R1903 Right lower quadrant abdominal swelling, mass and lump: Secondary | ICD-10-CM | POA: Diagnosis not present

## 2015-02-14 DIAGNOSIS — N839 Noninflammatory disorder of ovary, fallopian tube and broad ligament, unspecified: Secondary | ICD-10-CM | POA: Diagnosis not present

## 2015-02-14 DIAGNOSIS — Z124 Encounter for screening for malignant neoplasm of cervix: Secondary | ICD-10-CM | POA: Diagnosis not present

## 2015-02-14 DIAGNOSIS — Z01419 Encounter for gynecological examination (general) (routine) without abnormal findings: Secondary | ICD-10-CM | POA: Diagnosis not present

## 2015-02-14 DIAGNOSIS — N950 Postmenopausal bleeding: Secondary | ICD-10-CM | POA: Diagnosis not present

## 2015-03-10 ENCOUNTER — Ambulatory Visit: Payer: Medicare Other | Admitting: Gynecologic Oncology

## 2015-03-21 ENCOUNTER — Ambulatory Visit: Payer: Medicare Other | Attending: Gynecologic Oncology | Admitting: Gynecologic Oncology

## 2015-03-21 ENCOUNTER — Encounter: Payer: Self-pay | Admitting: Gynecologic Oncology

## 2015-03-21 VITALS — BP 112/70 | HR 78 | Temp 98.0°F | Resp 18 | Ht 62.0 in | Wt 123.2 lb

## 2015-03-21 DIAGNOSIS — N838 Other noninflammatory disorders of ovary, fallopian tube and broad ligament: Secondary | ICD-10-CM | POA: Diagnosis not present

## 2015-03-21 DIAGNOSIS — N950 Postmenopausal bleeding: Secondary | ICD-10-CM

## 2015-03-21 DIAGNOSIS — N839 Noninflammatory disorder of ovary, fallopian tube and broad ligament, unspecified: Secondary | ICD-10-CM | POA: Diagnosis not present

## 2015-03-21 NOTE — Progress Notes (Signed)
On pelvic examiantion a urethral curuncle was noted that likely corresponds to her symptoms of postemnopausal spotting.  An endometrial biopsy was performed secondary to her history of persistent Postmenopausal bleeding (if occult malignancy is identified, she should have hysterectomy at the time of the BSO).  Procedure Note:  Endometrial Biopsy  The procedure was explained.  The speculum was placed and the cervix was visualized.  The endometrial biopsy was performed with 2 passes of the endometrial biopsy pipelle. scant tissue was obtained. The uterus sounded to 6 cm.  The patient tolerated procedure   Kelsey Eva, MD

## 2015-03-21 NOTE — Progress Notes (Signed)
Consult Note: Gyn-Onc  Consult was requested by Dr. 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 R Ovarian mass    Assessment/Plan:  Ms. 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 She is anxious regarding whether or not it is ovarian cancer and desires its removal.  I had an extensive discussion with Kelsey Bradford regarding the likelihood that this is malignant. I discussed that he do not feel that is malignant based on its features on ultrasound, and associated normal CA-125. She does not have an increased family history risk for breast or ovarian cancers. I discussed that removal would involve a robotic-assisted laparoscopic BSO. Frozen section would be performed on the right ovary. Hysterectomy is not indicated unless malignancy is identified. This procedure would be an outpatient procedure and would likely require one to two-week recovery from work. I discussed that she has increased operative risks following her prior hernia repair with mesh. After reviewing the operative note seems that this required a right lower quadrant hernia repair and mesh placement. I discussed that mesh placement is commonly associated with increase adhesive disease and in the process of releasing Kelsey Bradford disease she may sustained injury to abdominal viscera. The patient excepts this increased risk associated with surgery.  We will schedule her for a BSO procedure to be performed at the end of August per her request. She'll be seen by preoperative clinic prior to surgery.   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 The patient has been having serial ultrasounds for postmenopausal bleeding. The first was in 2014. It revealed a thin endometrial stripe measuring 1-1/2 mm. She  continue to have spotting and therefore underwent repeated ultrasound in June 2016. This revealed an essentially normal uterus with a small less than once a meter fibroid. The right ovary had a small focal echogenic nonvascular area measuring 0.6 x 0.4 x 0.7 cm. The left ovary had a persistent simple follicular cyst measuring 0.6 x 0.6 x 0.5 cm. This no free fluid noted. A CA-125 was drawn on 02/14/2015 and was normal at 7.7. The patient has no symptoms concerning for prostatic ovarian cancer including no bloating, abdominal pain, early satiety, change in bladder or bowel habit. She is no family history of breast or ovarian cancer. She strongly desires removal of the tubes and ovaries are she is concerned about occult ovarian cancer.  The patient has a history of a laparoscopic hernia repair with Dr. Hassell Done approximately 2 years ago. Mesh was placed. This was a right inguinal hernia repair.  She also has a more recent history of bilateral hip replacement surgery. She is otherwise very healthy.   Current Meds:  Outpatient Encounter Prescriptions as of 03/21/2015  Medication Sig  . cholecalciferol (VITAMIN D) 400 UNITS TABS tablet Take 400 Units by mouth.  . levothyroxine (SYNTHROID, LEVOTHROID) 50 MCG tablet Take 50 mcg by mouth daily before breakfast.   . Multiple Vitamin (MULTIVITAMIN) tablet Take 1 tablet by mouth daily.  . pravastatin (PRAVACHOL) 80 MG tablet Take 80 mg by mouth daily.  . [DISCONTINUED] diphenhydrAMINE (SOMINEX) 25 MG tablet Take 25 mg by mouth at bedtime as needed for sleep. May repeat x 1 if ineffective  . [DISCONTINUED] docusate sodium  100 MG CAPS Take 100 mg by mouth 2 (two) times daily. (Patient not taking: Reported on 03/21/2015)  . [DISCONTINUED] ferrous sulfate 325 (65 FE) MG tablet Take 325 mg by mouth 2 (two) times daily.  . [DISCONTINUED] methocarbamol (ROBAXIN) 500 MG tablet Take 1 tablet (500 mg total) by mouth every 6 (six) hours as needed for muscle spasms. (Patient  not taking: Reported on 03/21/2015)  . [DISCONTINUED] oxyCODONE-acetaminophen (ROXICET) 5-325 MG per tablet Take one tablet by mouth every morning at 9am; Take one to two tablets by mouth every 4 hours as needed (Patient not taking: Reported on 03/21/2015)  . [DISCONTINUED] rivaroxaban (XARELTO) 10 MG TABS tablet Take 1 tablet (10 mg total) by mouth daily with breakfast. (Patient not taking: Reported on 03/21/2015)   No facility-administered encounter medications on file as of 03/21/2015.    Allergy: No Known Allergies  Social Hx:   History   Social History  . Marital Status: Divorced    Spouse Name: N/A  . Number of Children: 2  . Years of Education: med. sch.   Occupational History  .      dermatology specialist   Social History Main Topics  . Smoking status: Never Smoker   . Smokeless tobacco: Never Used  . Alcohol Use: Yes     Comment: 1-2 glasses of wine per month  . Drug Use: No  . Sexual Activity: Not Currently   Other Topics Concern  . Not on file   Social History Narrative    Past Surgical Hx:  Past Surgical History  Procedure Laterality Date  . Dilation and curettage of uterus  1989    for miscarriage  . Inguinal hernia repair  09/01/2012    Procedure: HERNIA REPAIR INGUINAL ADULT;  Surgeon: Pedro Earls, MD;  Location: [facility];  Service: General;  Laterality: Right;  laparoscopic assisted  . Laparoscopic abdominal exploration  09/01/2012    Procedure: LAPAROSCOPIC ABDOMINAL EXPLORATION;  Surgeon: Pedro Earls, MD;  Location: [facility];  Service: General;  Laterality: N/A;  . Spigelian hernia  09/01/2012    Procedure: SPIGELIAN HERNIA;  Surgeon: Pedro Earls, MD;  Location: [facility];  Service: General;  Laterality: Right;  laparoscopic assisted  . Insertion of mesh  09/01/2012    Procedure: INSERTION OF MESH;  Surgeon: Pedro Earls, MD;  Location: [facility];  Service: General;  Laterality: Right;  spigelian and inguinal  . Colonoscopy    . Bilateral anterior  total hip arthroplasty Bilateral 10/09/2013    Procedure: BILATERAL ANTERIOR TOTAL HIP ARTHROPLASTY;  Surgeon: Mcarthur Rossetti, MD;  Location: [facility];  Service: Orthopedics;  Laterality: Bilateral;    Past Medical Hx:  Past Medical History  Diagnosis Date  . GERD (gastroesophageal reflux disease)   . Hyperlipidemia   . Thyroid disease   . Hypothyroidism   . Hypercholesteremia   . Leg pain     weakness, generalized  . Osteoarthritis     rt hip    Past Gynecological History:  SVD x 2  No LMP recorded. Patient is postmenopausal.  Family Hx:  Family History  Problem Relation Age of Onset  . Cancer Mother     breast  . Stroke Mother   . Diabetes Father   . Multiple sclerosis Sister   . Multiple sclerosis Maternal Aunt     Review of Systems:  Constitutional  Feels well,   ENT Normal appearing ears and nares bilaterally Skin/Breast  No rash, sores, jaundice, itching, dryness Cardiovascular  No chest pain, shortness of breath, or edema  Pulmonary  No cough or wheeze.  Gastro Intestinal  No nausea, vomitting, or diarrhoea. No bright red blood per rectum, no abdominal pain, change in bowel movement, or constipation.  Genito Urinary  No frequency, urgency, dysuria, see HPI Musculo Skeletal  No myalgia, arthralgia, joint swelling or pain  Neurologic  No weakness, numbness, change in gait,  Psychology  No depression, anxiety, insomnia.   Vitals:  Blood pressure 112/70, pulse 78, temperature 98 F (36.7 C), temperature source Oral, resp. rate 18, height 5\' 2"  (1.575 m), weight 123 lb 3.2 oz (55.883 kg), SpO2 100 %.  Physical Exam: WD in NAD Neck  Supple NROM, without any enlargements.  Lymph Node Survey No cervical supraclavicular or inguinal adenopathy Cardiovascular  Pulse normal rate, regularity and rhythm. S1 and S2 normal.  Lungs  Clear to auscultation bilateraly, without wheezes/crackles/rhonchi. Good air movement.  Skin  No rash/lesions/breakdown   Psychiatry  Alert and oriented to person, place, and time  Abdomen  Normoactive bowel sounds, abdomen soft, non-tender and thin without evidence of hernia.  Back No CVA tenderness Genito Urinary  Vulva/vagina: Normal external female genitalia.  No lesions. No discharge or bleeding.  Bladder/urethra:  No lesions or masses, well supported bladder  Vagina: normal  Cervix: Normal appearing, no lesions.  Uterus: Small, mobile, no parametrial involvement or nodularity.  Adnexa: no masses appreciated. Rectal  Good tone, no masses no cul de sac nodularity.  Extremities  No bilateral cyanosis, clubbing or edema.   Donaciano Eva, MD   03/21/2015, 12:03 PM

## 2015-03-21 NOTE — Patient Instructions (Signed)
Preparing for your Surgery  Plan for surgery on August 30 with Dr. Denman George.  Pre-operative Testing -You will receive a phone call from presurgical testing at [facility] to arrange for a pre-operative testing appointment before your surgery.  This appointment normally occurs one to two weeks before your scheduled surgery.   -Bring your insurance card, copy of an advanced directive if applicable, medication list  -At that visit, you will be asked to sign a consent for a possible blood transfusion in case a transfusion becomes necessary during surgery.  The need for a blood transfusion is rare but having consent is a necessary part of your care.     -You should not be taking blood thinners or aspirin at least ten days prior to surgery unless instructed by your surgeon.  Day Before Surgery at [place] will be asked to take in only clear liquids the day before surgery.  Examples of clear liquids include broths, jello, and clear juices.  Avoid carbonated drinks because it can make your surgery more difficult.  You will be advised to have nothing to eat or drink after midnight the evening before.    Your role in recovery Your role is to become active as soon as directed by your doctor, while still giving yourself time to heal.  Rest when you feel tired. You will be asked to do the following in order to speed your recovery:  - Cough and breathe deeply. This helps toclear and expand your lungs and can prevent pneumonia. You may be given a spirometer to practice deep breathing. A staff member will show you how to use the spirometer. - Do mild physical activity. Walking or moving your legs help your circulation and body functions return to normal. A staff member will help you when you try to walk and will provide you with simple exercises. Do not try to get up or walk alone the first time. - Actively manage your pain. Managing your pain lets you move in comfort. We will ask you to  rate your pain on a scale of zero to 10. It is your responsibility to tell your doctor or nurse where and how much you hurt so your pain can be treated.  Special Considerations -If you are diabetic, you may be placed on insulin after surgery to have closer control over your blood sugars to promote healing and recovery.  This does not mean that you will be discharged on insulin.  If applicable, your oral antidiabetics will be resumed when you are tolerating a solid diet.  -Your final pathology results from surgery should be available by the Friday after surgery and the results will be relayed to you when available.  Blood Transfusion Information WHAT IS A BLOOD TRANSFUSION? A transfusion is the replacement of blood or some of its parts. Blood is made up of multiple cells which provide different functions.  Red blood cells carry oxygen and are used for blood loss replacement.  White blood cells fight against infection.  Platelets control bleeding.  Plasma helps clot blood.  Other blood products are available for specialized needs, such as hemophilia or other clotting disorders. BEFORE THE TRANSFUSION  Who gives blood for transfusions?   You may be able to donate blood to be used at a later date on yourself (autologous donation).  Relatives can be asked to donate blood. This is generally not any safer than if you have received blood from a stranger. The same precautions are taken to ensure safety  when a relative's blood is donated.  Healthy volunteers who are fully evaluated to make sure their blood is safe. This is blood bank blood. Transfusion therapy is the safest it has ever been in the practice of medicine. Before blood is taken from a donor, a complete history is taken to make sure that person has no history of diseases nor engages in risky social behavior (examples are intravenous drug use or sexual activity with multiple partners). The donor's travel history is screened to minimize  risk of transmitting infections, such as malaria. The donated blood is tested for signs of infectious diseases, such as HIV and hepatitis. The blood is then tested to be sure it is compatible with you in order to minimize the chance of a transfusion reaction. If you or a relative donates blood, this is often done in anticipation of surgery and is not appropriate for emergency situations. It takes many days to process the donated blood. RISKS AND COMPLICATIONS Although transfusion therapy is very safe and saves many lives, the main dangers of transfusion include:   Getting an infectious disease.  Developing a transfusion reaction. This is an allergic reaction to something in the blood you were given. Every precaution is taken to prevent this. The decision to have a blood transfusion has been considered carefully by your caregiver before blood is given. Blood is not given unless the benefits outweigh the risks.

## 2015-03-24 ENCOUNTER — Telehealth: Payer: Self-pay | Admitting: *Deleted

## 2015-03-24 ENCOUNTER — Telehealth: Payer: Self-pay | Admitting: Gynecologic Oncology

## 2015-03-24 NOTE — Telephone Encounter (Signed)
Patient called stating she changed her mind and would like to cancel her surgery on August 30.  She would like to repeat the ultrasound in September.  Dr. Denman George notified.  Dr. Denman George would like her ultrasound to be performed at Dr. Garwin Brothers' office, referring physician, so it could be compared with the previous US.  Dr. Garwin Brothers' office will be notified to arrange the ultrasound.

## 2015-03-24 NOTE — Telephone Encounter (Signed)
Per Dr. Denman George , Dr cousins office was called and asked to arrange a U/S for the patient in sept, so the previous U/S can be compared. Per Rise Paganini RN  Dr. Maudry Diego will be notified and a U/S f will be scheduled once Dr. Harvie Bridge approves.  Rise Paganini will contact GYN ONC with status once Dr. Harvie Bridge has been notified

## 2015-03-25 ENCOUNTER — Telehealth: Payer: Self-pay | Admitting: Nurse Practitioner

## 2015-03-25 DIAGNOSIS — M7711 Lateral epicondylitis, right elbow: Secondary | ICD-10-CM | POA: Diagnosis not present

## 2015-03-25 DIAGNOSIS — M1812 Unilateral primary osteoarthritis of first carpometacarpal joint, left hand: Secondary | ICD-10-CM | POA: Diagnosis not present

## 2015-03-25 DIAGNOSIS — M19011 Primary osteoarthritis, right shoulder: Secondary | ICD-10-CM | POA: Diagnosis not present

## 2015-03-25 NOTE — Telephone Encounter (Signed)
Left voice message for patient to call [place] with Dr. Serita Grit office for review of test results. The voicemail identifies this pt by name. Clinic phone number given for call back.

## 2015-03-26 ENCOUNTER — Telehealth: Payer: Self-pay | Admitting: *Deleted

## 2015-03-26 NOTE — Telephone Encounter (Signed)
Per Dr. Denman George, patient needs repeat ultrasound in September. She would prefer the [place] to be done at Dr. Harvie Bridge office so images can be compared to initial [place]. Called and spoke with Seth Bake at Dr. Harvie Bridge office and she is agreeable to notify Dr. Harvie Bridge of this and to get scheduler to call patient to schedule future appt.

## 2015-03-27 NOTE — Telephone Encounter (Signed)
Patient returned call. Per Kelsey John, NP patient notified that the endometrial biopsy did not show cancer and that our office has notified Dr. Harvie Bridge office to schedule a f/u ultrasound in September. Pt states that she has already received a phone call with the [place] appt. No other questions or concerns noted at this time.

## 2015-04-08 DIAGNOSIS — M79601 Pain in right arm: Secondary | ICD-10-CM | POA: Diagnosis not present

## 2015-04-08 DIAGNOSIS — M7711 Lateral epicondylitis, right elbow: Secondary | ICD-10-CM | POA: Diagnosis not present

## 2015-04-08 DIAGNOSIS — M6281 Muscle weakness (generalized): Secondary | ICD-10-CM | POA: Diagnosis not present

## 2015-04-11 DIAGNOSIS — M1812 Unilateral primary osteoarthritis of first carpometacarpal joint, left hand: Secondary | ICD-10-CM | POA: Diagnosis not present

## 2015-04-22 DIAGNOSIS — M19011 Primary osteoarthritis, right shoulder: Secondary | ICD-10-CM | POA: Diagnosis not present

## 2015-04-29 DIAGNOSIS — M1812 Unilateral primary osteoarthritis of first carpometacarpal joint, left hand: Secondary | ICD-10-CM | POA: Diagnosis not present

## 2015-05-23 DIAGNOSIS — N830 Follicular cyst of ovary: Secondary | ICD-10-CM | POA: Diagnosis not present

## 2015-05-30 DIAGNOSIS — Z23 Encounter for immunization: Secondary | ICD-10-CM | POA: Diagnosis not present

## 2015-06-03 ENCOUNTER — Other Ambulatory Visit: Payer: Self-pay

## 2015-06-03 DIAGNOSIS — Z1231 Encounter for screening mammogram for malignant neoplasm of breast: Secondary | ICD-10-CM

## 2015-07-04 DIAGNOSIS — Z23 Encounter for immunization: Secondary | ICD-10-CM | POA: Diagnosis not present

## 2015-07-15 ENCOUNTER — Ambulatory Visit
Admission: RE | Admit: 2015-07-15 | Discharge: 2015-07-15 | Disposition: A | Discharge status: Home / Self Care | Payer: Medicare Other | Source: Ambulatory Visit

## 2015-07-15 DIAGNOSIS — Z1231 Encounter for screening mammogram for malignant neoplasm of breast: Secondary | ICD-10-CM

## 2015-07-22 DIAGNOSIS — N8302 Follicular cyst of left ovary: Secondary | ICD-10-CM | POA: Diagnosis not present

## 2015-10-01 DIAGNOSIS — R58 Hemorrhage, not elsewhere classified: Secondary | ICD-10-CM | POA: Diagnosis not present

## 2015-11-04 DIAGNOSIS — E7800 Pure hypercholesterolemia, unspecified: Secondary | ICD-10-CM | POA: Diagnosis not present

## 2015-11-04 DIAGNOSIS — E559 Vitamin D deficiency, unspecified: Secondary | ICD-10-CM | POA: Diagnosis not present

## 2015-11-04 DIAGNOSIS — M8580 Other specified disorders of bone density and structure, unspecified site: Secondary | ICD-10-CM | POA: Diagnosis not present

## 2015-11-04 DIAGNOSIS — E039 Hypothyroidism, unspecified: Secondary | ICD-10-CM | POA: Diagnosis not present

## 2015-11-04 DIAGNOSIS — Z8349 Family history of other endocrine, nutritional and metabolic diseases: Secondary | ICD-10-CM | POA: Diagnosis not present

## 2015-11-04 DIAGNOSIS — Z833 Family history of diabetes mellitus: Secondary | ICD-10-CM | POA: Diagnosis not present

## 2015-11-04 DIAGNOSIS — R55 Syncope and collapse: Secondary | ICD-10-CM | POA: Diagnosis not present

## 2015-11-06 ENCOUNTER — Other Ambulatory Visit: Payer: Self-pay | Admitting: Internal Medicine

## 2015-11-06 DIAGNOSIS — G40409 Other generalized epilepsy and epileptic syndromes, not intractable, without status epilepticus: Secondary | ICD-10-CM

## 2015-11-08 ENCOUNTER — Ambulatory Visit
Admission: RE | Admit: 2015-11-08 | Discharge: 2015-11-08 | Disposition: A | Discharge status: Home / Self Care | Payer: Medicare Other | Source: Ambulatory Visit | Attending: Internal Medicine | Admitting: Internal Medicine

## 2015-11-08 DIAGNOSIS — G40409 Other generalized epilepsy and epileptic syndromes, not intractable, without status epilepticus: Secondary | ICD-10-CM

## 2015-11-08 DIAGNOSIS — R55 Syncope and collapse: Secondary | ICD-10-CM | POA: Diagnosis not present

## 2015-11-11 DIAGNOSIS — N9089 Other specified noninflammatory disorders of vulva and perineum: Secondary | ICD-10-CM | POA: Diagnosis not present

## 2015-11-18 DIAGNOSIS — Z1159 Encounter for screening for other viral diseases: Secondary | ICD-10-CM | POA: Diagnosis not present

## 2015-11-18 DIAGNOSIS — Z114 Encounter for screening for human immunodeficiency virus [HIV]: Secondary | ICD-10-CM | POA: Diagnosis not present

## 2015-11-18 DIAGNOSIS — Z113 Encounter for screening for infections with a predominantly sexual mode of transmission: Secondary | ICD-10-CM | POA: Diagnosis not present

## 2015-12-16 ENCOUNTER — Encounter: Payer: Self-pay | Admitting: Neurology

## 2015-12-16 ENCOUNTER — Ambulatory Visit (INDEPENDENT_AMBULATORY_CARE_PROVIDER_SITE_OTHER): Payer: Medicare Other | Admitting: Neurology

## 2015-12-16 VITALS — BP 104/70 | HR 68 | Resp 20 | Ht 61.0 in | Wt 122.0 lb

## 2015-12-16 DIAGNOSIS — R55 Syncope and collapse: Secondary | ICD-10-CM | POA: Diagnosis not present

## 2015-12-16 NOTE — Patient Instructions (Signed)
Near-Syncope Near-syncope (commonly known as near fainting) is sudden weakness, dizziness, or feeling like you might pass out. This can happen when getting up or while standing for a long time. It is caused by a sudden decrease in blood flow to the brain, which can occur for various reasons. Most of the reasons are not serious.  HOME CARE Watch your condition for any changes.  Have someone stay with you until you feel stable.  If you feel like you are going to pass out:  Lie down right away.  Prop your feet up if you can.  Breathe deeply and steadily.  Move only when the feeling has gone away. Most of the time, this feeling lasts only a few minutes. You may feel tired for several hours.  Drink enough fluids to keep your pee (urine) clear or pale yellow.  If you are taking blood pressure or heart medicine, stand up slowly.  Follow up with your doctor as told. GET HELP RIGHT AWAY IF:   You have a severe headache.  You have unusual pain in the chest, belly (abdomen), or back.  You have bleeding from the mouth or butt (rectum), or you have black or tarry poop (stool).  You feel your heart beat differently than normal, or you have a very fast pulse.  You pass out, or you twitch and shake when you pass out.  You pass out when sitting or lying down.  You feel confused.  You have trouble walking.  You are weak.  You have vision problems. MAKE SURE YOU:   Understand these instructions.  Will watch your condition.  Will get help right away if you are not doing well or get worse.   This information is not intended to replace advice given to you by your health care provider. Make sure you discuss any questions you have with your health care provider.   Document Released: 02/16/2008 Document Revised: 09/20/2014 Document Reviewed: 02/02/2013 Elsevier Interactive Patient Education 2016 [employer]  

## 2015-12-16 NOTE — Progress Notes (Addendum)
NEUROLOGY CLINIC   Provider:  Larey Seat, M D  Referring Provider: Primary Care Physician:  Horatio Pel, MD  Chief Complaint  Patient presents with  . 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 Shelia Media, for a follow up on a possible seizure .  Chief complaint according to patient : "  I woke up and wanted to go to the bathroom but felt unsteady and couldn't take another step- at least not coordinated - towards the toilet. She did not make an attempt to sit down and instead hit her head. She suffered some significant bruising at the high temple, right side. A friend saw her and witnessed the event, and is convinced that she had a seizure she was out for about 5 seconds,  she was rigid, her eyes were wide open but she did not verbally  respond or focus visually. Dr. Shelia Media also ordered an MRI to make sure that there was no intracranial injury, and the MRI returned as normal. Since the patient seemed to be on the way of passing out before she fell and hit her head, the possibility of a convulsive syncope or impact seizure, head injury comes to mind, she may as well have felt dizzy because his seizure was at the onset. She assured me she drank no alcohol.  She went to sleep after that and felt completely fine. She has not had any long term effects.      Review of Systems: Out of a complete 14 system review, the patient complains of only the following symptoms, and all other reviewed systems are negative. possible syncope, convulsion   CLINICAL DATA: Near syncope. [place] and hit right-side of head 1 week ago. Possible seizure.  EXAM: MRI HEAD WITHOUT CONTRAST  TECHNIQUE: Multiplanar, multiecho pulse sequences of the brain and surrounding structures were obtained without intravenous contrast.  COMPARISON: None.  FINDINGS: No evidence for acute infarction,  hemorrhage, mass lesion, hydrocephalus, or extra-axial fluid. Normal cerebral volume. No white matter disease. Pituitary, pineal, and cerebellar tonsils unremarkable. No upper cervical lesions.  Flow voids are maintained throughout the carotid, basilar, and vertebral arteries. There are no areas of chronic hemorrhage. Visualized calvarium, skull base, and upper cervical osseous structures unremarkable. Scalp and extracranial soft tissues, orbits, sinuses, and mastoids show no acute process.  Thin-section imaging through the temporal lobes demonstrates no asymmetry, mass, or inflammatory process.  IMPRESSION: Negative exam. No acute intracranial findings.  No seizure focus is evident. No posttraumatic sequelae are identified.   Electronically Signed  By: Staci Righter M.D.  On: 11/08/2015 14:27   Social History   Social History  . Marital Status: Divorced    Spouse Name: N/A  . Number of Children: 2  . Years of Education: med. sch.   Occupational History  .      dermatology specialist   Social History Main Topics  . Smoking status: Never Smoker   . Smokeless tobacco: Never Used  . Alcohol Use: Yes     Comment: 1-2 glasses of wine per month  . Drug Use: No  . Sexual Activity: Not Currently   Other Topics Concern  . Not on file   Social History Narrative   Drinks up to 6 cups of caffeine daily.    Family History  Problem Relation Age of Onset  . Cancer Mother  breast  . Stroke Mother   . Diabetes Father   . Multiple sclerosis Sister   . Multiple sclerosis Maternal Aunt     Past Medical History  Diagnosis Date  . GERD (gastroesophageal reflux disease)   . Hyperlipidemia   . Thyroid disease   . Hypothyroidism   . Hypercholesteremia   . Leg pain     weakness, generalized  . Osteoarthritis     rt hip    Past Surgical History  Procedure Laterality Date  . Dilation and curettage of uterus  1989    for miscarriage  . Inguinal hernia  repair  09/01/2012    Procedure: HERNIA REPAIR INGUINAL ADULT;  Surgeon: Pedro Earls, MD;  Location: [facility];  Service: General;  Laterality: Right;  laparoscopic assisted  . Laparoscopic abdominal exploration  09/01/2012    Procedure: LAPAROSCOPIC ABDOMINAL EXPLORATION;  Surgeon: Pedro Earls, MD;  Location: [facility];  Service: General;  Laterality: N/A;  . Spigelian hernia  09/01/2012    Procedure: SPIGELIAN HERNIA;  Surgeon: Pedro Earls, MD;  Location: [facility];  Service: General;  Laterality: Right;  laparoscopic assisted  . Insertion of mesh  09/01/2012    Procedure: INSERTION OF MESH;  Surgeon: Pedro Earls, MD;  Location: [facility];  Service: General;  Laterality: Right;  spigelian and inguinal  . Colonoscopy    . Bilateral anterior total hip arthroplasty Bilateral 10/09/2013    Procedure: BILATERAL ANTERIOR TOTAL HIP ARTHROPLASTY;  Surgeon: Mcarthur Rossetti, MD;  Location: [place];  Service: Orthopedics;  Laterality: Bilateral;    Current Outpatient Prescriptions  Medication Sig Dispense Refill  . Calcium Carbonate (CALCIUM 600 PO) Take 600 mg by mouth.    . Cholecalciferol (VITAMIN D3) 2000 units TABS Take 2,000 Units by mouth.    . levothyroxine (SYNTHROID, LEVOTHROID) 50 MCG tablet Take 50 mcg by mouth daily before breakfast.     . Multiple Vitamin (MULTIVITAMIN) tablet Take 1 tablet by mouth daily.    Marland Kitchen omeprazole (PRILOSEC) 20 MG capsule Take 20 mg by mouth daily.    . pravastatin (PRAVACHOL) 80 MG tablet Take 80 mg by mouth daily.     No current facility-administered medications for this visit.    Allergies as of 12/16/2015  . (No Known Allergies)    Vitals: BP 104/70 mmHg  Pulse 68  Resp 20  Ht 5\' 1"  (1.549 m)  Wt 122 lb (55.339 kg)  BMI 23.06 kg/m2 Last Weight:  Wt Readings from Last 1 Encounters:  12/16/15 122 lb (55.339 kg)   [id number] mass index is 23.06 kg/(m^2).     Last Height:   Ht Readings from Last 1 Encounters:  12/16/15 5\' 1"  (1.549 m)      Physical exam:  General: The patient is awake, alert and appears not in acute distress. The patient is well groomed. Head: Normocephalic, atraumatic. Cardiovascular:  Regular rate and rhythm , without  murmurs or carotid bruit, and without distended neck veins. Respiratory: Lungs are clear to auscultation. Skin:  Without evidence of edema, or rash Trunk:  Neurologic exam : The patient is awake and alert, oriented to place and time.   Memory subjective described as intact.  Attention span & concentration ability appears normal.  Speech is fluent,  without dysarthria, dysphonia or aphasia.  Mood and affect are appropriate.  Cranial nerves: Pupils are equal and briskly reactive to light. Funduscopic exam without  evidence of pallor or edema. Extraocular movements  in vertical and horizontal planes  intact and without nystagmus. Visual fields by finger perimetry are intact. Hearing to finger rub intact.   Facial sensation intact to fine touch.  Facial motor strength is symmetric and tongue and uvula move midline. Shoulder shrug was symmetrical.   Motor exam: Normal tone, muscle bulk and symmetric strength in all extremities. Sensory:  Fine touch, pinprick and vibration were tested in all extremities.  Proprioception tested in the upper extremities was normal. Coordination: Rapid alternating movements in the fingers/hands was normal.  Finger-to-nose maneuver  normal without evidence of ataxia, dysmetria or tremor. Gait and station: Patient walks without assistive device and is able unassisted to climb up to the exam table. Strength within normal limits.  Stance is stable and normal.  Romberg testing is negative.  Deep tendon reflexes: in the  upper and lower extremities are symmetric and intact.  Babinski maneuver response is downgoing.  The patient was advised of the nature of the diagnosed disorder , the treatment options and risks for general a health and wellness arising from not  treating the condition.  I spent more than 35 minutes of face to face time with the patient. Greater than 50% of time was spent in counseling and coordination of care. We have discussed the diagnosis and differential and I answered the patient's questions.      Dear Dr. 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 At that time she had gait difficulties but were non-neurologic in origin and related to hip degeneration. Paisyn was as usual very enthusiastic and seemed not the least concerned that she may have suffered truly a seizure. I have relied on her account of what the eyewitness reported. Since he convulsions and M.D. spare and a brief period of limited awareness or loss of awareness happened after she hit her head IM leaning towards a impact related convulsions. Why she was unsteady on her feet is unexplained but may be related to a syncopal event. We reviewed her current medications, recent symptoms and there is no evidence that she had a second event. As you know, even if she had suffered a seizure, a single seizure would not be treated with antiepileptic medication.  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 What led to the fall in the first place could have been a syncope. Please note that there are also convulsive syncopes. The patient felt that she was in a near syncope and never lost complete awareness of her surroundings but the description of her friend is that of somebody who did not adequately respond verbally and could not focus her gaze after she hit  her head and for very brief period of time.   Reviewed MRI study through [place]. (  Dr. Christean Leaf mother died of a stroke and her brother recently suffered an  amyloid vasculopathy related brain- bleed) .     Plan:  Treatment plan and additional workup :  PRN, if needed. No EEG needed.  I have ordered a cardiac monitor, to evaluate for syncope of cardiac origin.  I took the  liberty to ask for a report to me and to Dr. Deland Pretty.   Asencion Partridge Vonceil Upshur MD  12/16/2015   Thank you for allowing me to see our mutual patient, Dr. Crista Luria, today in a new consultation !  CC:   Deland Pretty, MD

## 2016-02-03 DIAGNOSIS — H43811 Vitreous degeneration, right eye: Secondary | ICD-10-CM | POA: Diagnosis not present

## 2016-02-17 DIAGNOSIS — H43811 Vitreous degeneration, right eye: Secondary | ICD-10-CM | POA: Diagnosis not present

## 2016-03-09 DIAGNOSIS — M2011 Hallux valgus (acquired), right foot: Secondary | ICD-10-CM | POA: Diagnosis not present

## 2016-03-09 DIAGNOSIS — M2012 Hallux valgus (acquired), left foot: Secondary | ICD-10-CM | POA: Diagnosis not present

## 2016-03-09 DIAGNOSIS — M21621 Bunionette of right foot: Secondary | ICD-10-CM | POA: Diagnosis not present

## 2016-03-09 DIAGNOSIS — M21622 Bunionette of left foot: Secondary | ICD-10-CM | POA: Diagnosis not present

## 2016-03-09 DIAGNOSIS — M7661 Achilles tendinitis, right leg: Secondary | ICD-10-CM | POA: Diagnosis not present

## 2016-04-20 DIAGNOSIS — Z79899 Other long term (current) drug therapy: Secondary | ICD-10-CM | POA: Diagnosis not present

## 2016-04-20 DIAGNOSIS — K219 Gastro-esophageal reflux disease without esophagitis: Secondary | ICD-10-CM | POA: Diagnosis not present

## 2016-06-01 DIAGNOSIS — H1013 Acute atopic conjunctivitis, bilateral: Secondary | ICD-10-CM | POA: Diagnosis not present

## 2016-06-18 DIAGNOSIS — K219 Gastro-esophageal reflux disease without esophagitis: Secondary | ICD-10-CM | POA: Diagnosis not present

## 2016-06-22 DIAGNOSIS — Z23 Encounter for immunization: Secondary | ICD-10-CM | POA: Diagnosis not present

## 2016-07-21 ENCOUNTER — Other Ambulatory Visit: Payer: Self-pay | Admitting: Obstetrics and Gynecology

## 2016-07-21 DIAGNOSIS — Z1231 Encounter for screening mammogram for malignant neoplasm of breast: Secondary | ICD-10-CM

## 2016-08-23 DIAGNOSIS — N83291 Other ovarian cyst, right side: Secondary | ICD-10-CM | POA: Diagnosis not present

## 2016-08-23 DIAGNOSIS — R350 Frequency of micturition: Secondary | ICD-10-CM | POA: Diagnosis not present

## 2016-08-23 DIAGNOSIS — N8111 Cystocele, midline: Secondary | ICD-10-CM | POA: Diagnosis not present

## 2016-08-25 ENCOUNTER — Ambulatory Visit
Admission: RE | Admit: 2016-08-25 | Discharge: 2016-08-25 | Disposition: A | Discharge status: Home / Self Care | Payer: Medicare Other | Source: Ambulatory Visit | Attending: Obstetrics and Gynecology | Admitting: Obstetrics and Gynecology

## 2016-08-25 DIAGNOSIS — Z1231 Encounter for screening mammogram for malignant neoplasm of breast: Secondary | ICD-10-CM | POA: Diagnosis not present

## 2016-09-17 DIAGNOSIS — R490 Dysphonia: Secondary | ICD-10-CM | POA: Diagnosis not present

## 2016-09-17 DIAGNOSIS — K219 Gastro-esophageal reflux disease without esophagitis: Secondary | ICD-10-CM | POA: Diagnosis not present

## 2016-10-07 DIAGNOSIS — R3914 Feeling of incomplete bladder emptying: Secondary | ICD-10-CM | POA: Diagnosis not present

## 2016-10-07 DIAGNOSIS — N8111 Cystocele, midline: Secondary | ICD-10-CM | POA: Diagnosis not present

## 2016-10-07 DIAGNOSIS — R351 Nocturia: Secondary | ICD-10-CM | POA: Diagnosis not present

## 2016-10-21 DIAGNOSIS — R3914 Feeling of incomplete bladder emptying: Secondary | ICD-10-CM | POA: Diagnosis not present

## 2016-10-21 DIAGNOSIS — N8111 Cystocele, midline: Secondary | ICD-10-CM | POA: Diagnosis not present

## 2016-10-21 DIAGNOSIS — R351 Nocturia: Secondary | ICD-10-CM | POA: Diagnosis not present

## 2016-12-21 DIAGNOSIS — N8111 Cystocele, midline: Secondary | ICD-10-CM | POA: Diagnosis not present

## 2016-12-29 DIAGNOSIS — M1812 Unilateral primary osteoarthritis of first carpometacarpal joint, left hand: Secondary | ICD-10-CM | POA: Diagnosis not present

## 2016-12-30 DIAGNOSIS — N8111 Cystocele, midline: Secondary | ICD-10-CM | POA: Diagnosis not present

## 2016-12-30 DIAGNOSIS — N814 Uterovaginal prolapse, unspecified: Secondary | ICD-10-CM | POA: Diagnosis not present

## 2017-01-10 DIAGNOSIS — K219 Gastro-esophageal reflux disease without esophagitis: Secondary | ICD-10-CM | POA: Diagnosis not present

## 2017-01-10 DIAGNOSIS — E7800 Pure hypercholesterolemia, unspecified: Secondary | ICD-10-CM | POA: Diagnosis not present

## 2017-01-10 DIAGNOSIS — E039 Hypothyroidism, unspecified: Secondary | ICD-10-CM | POA: Diagnosis not present

## 2017-02-09 DIAGNOSIS — E7800 Pure hypercholesterolemia, unspecified: Secondary | ICD-10-CM | POA: Diagnosis not present

## 2017-02-09 DIAGNOSIS — E559 Vitamin D deficiency, unspecified: Secondary | ICD-10-CM | POA: Diagnosis not present

## 2017-02-09 DIAGNOSIS — E039 Hypothyroidism, unspecified: Secondary | ICD-10-CM | POA: Diagnosis not present

## 2017-02-09 DIAGNOSIS — E875 Hyperkalemia: Secondary | ICD-10-CM | POA: Diagnosis not present

## 2017-02-28 DIAGNOSIS — K219 Gastro-esophageal reflux disease without esophagitis: Secondary | ICD-10-CM | POA: Diagnosis not present

## 2017-02-28 DIAGNOSIS — E7800 Pure hypercholesterolemia, unspecified: Secondary | ICD-10-CM | POA: Diagnosis not present

## 2017-02-28 DIAGNOSIS — E039 Hypothyroidism, unspecified: Secondary | ICD-10-CM | POA: Diagnosis not present

## 2017-03-04 IMAGING — MG 2D DIGITAL SCREENING BILATERAL MAMMOGRAM WITH CAD AND ADJUNCT TO
9 of 12 series · 9 of 28 positions shown · non-contrast
Comparison: Previous exam(s).

CLINICAL DATA: Screening.

EXAM:
2D DIGITAL SCREENING BILATERAL MAMMOGRAM WITH CAD AND ADJUNCT TOMO

[L MLO]
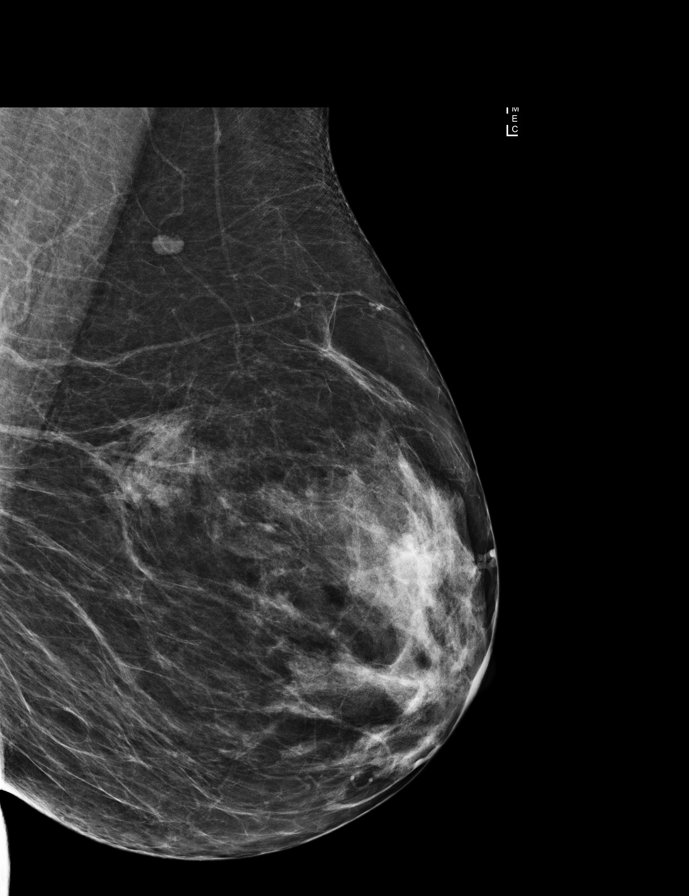

[L CC synth-2D]
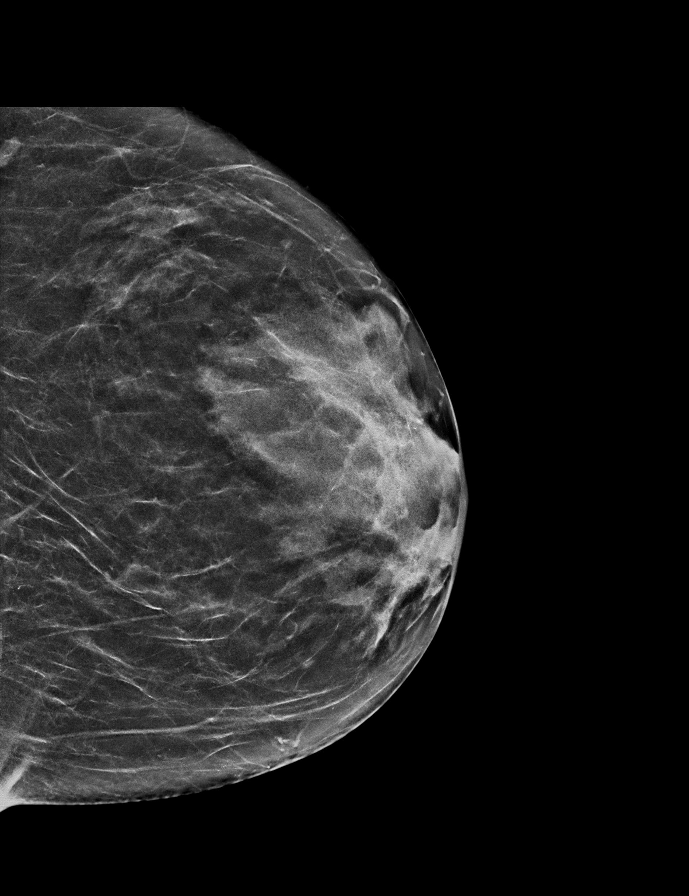

[R MLO]
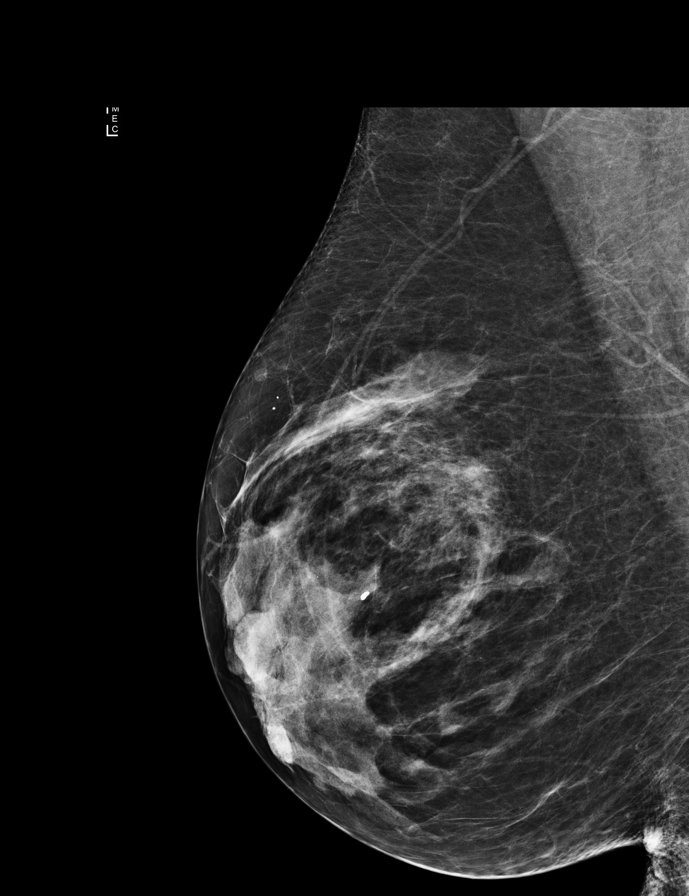

[L MLO synth-2D]
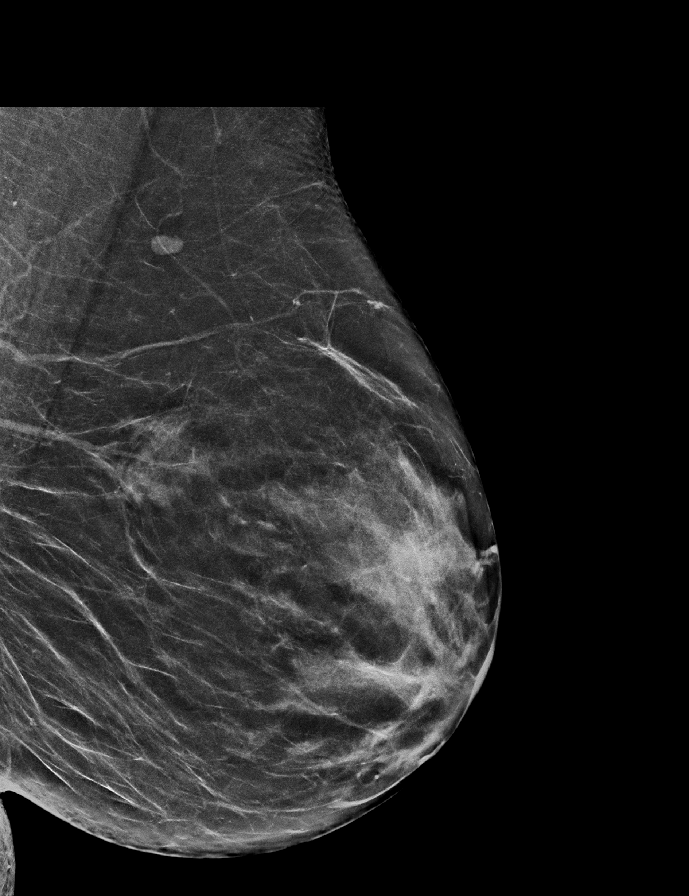

[L CC]
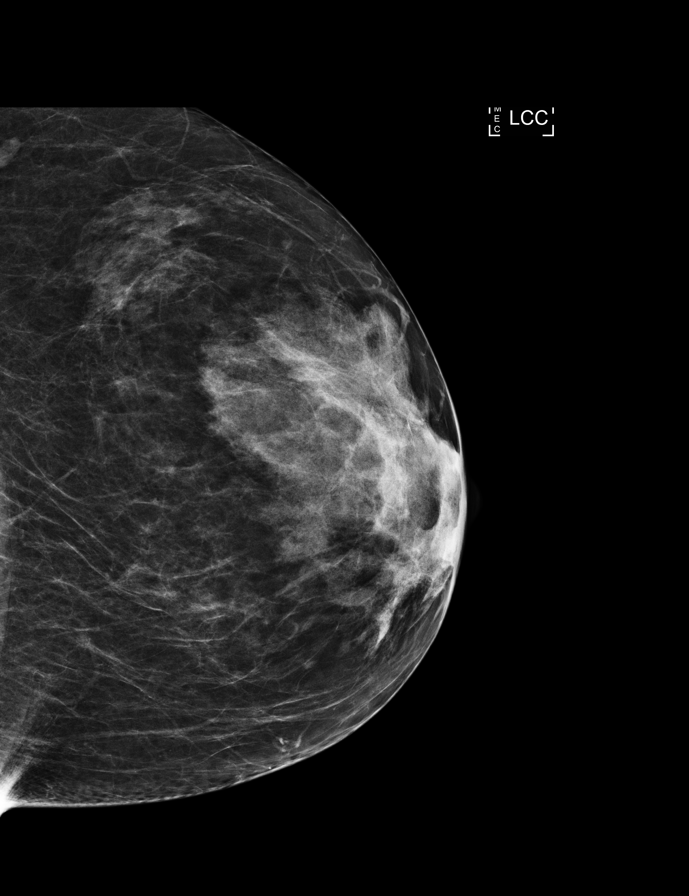

[R CC synth-2D]
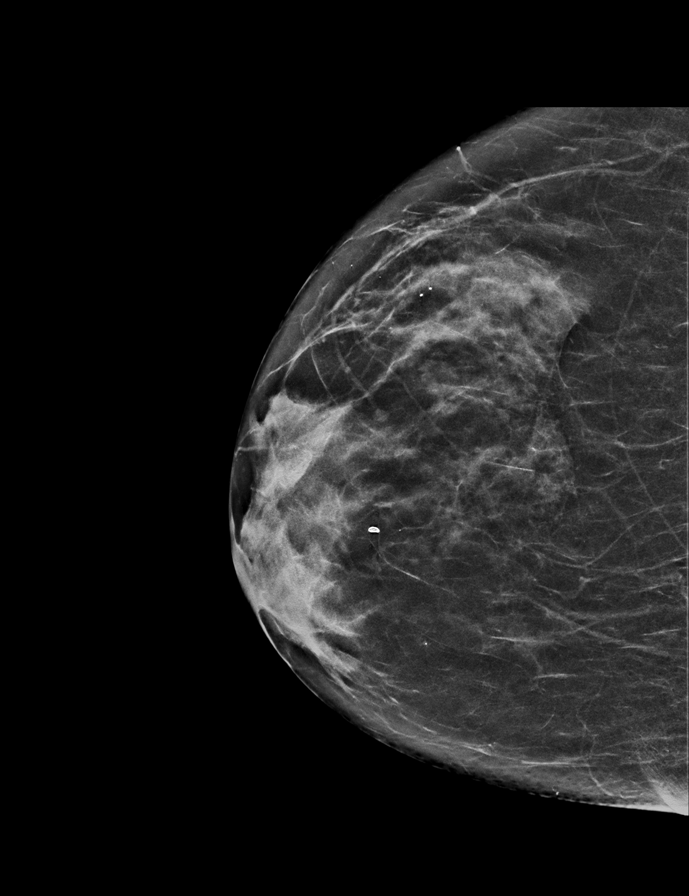

[R CC]
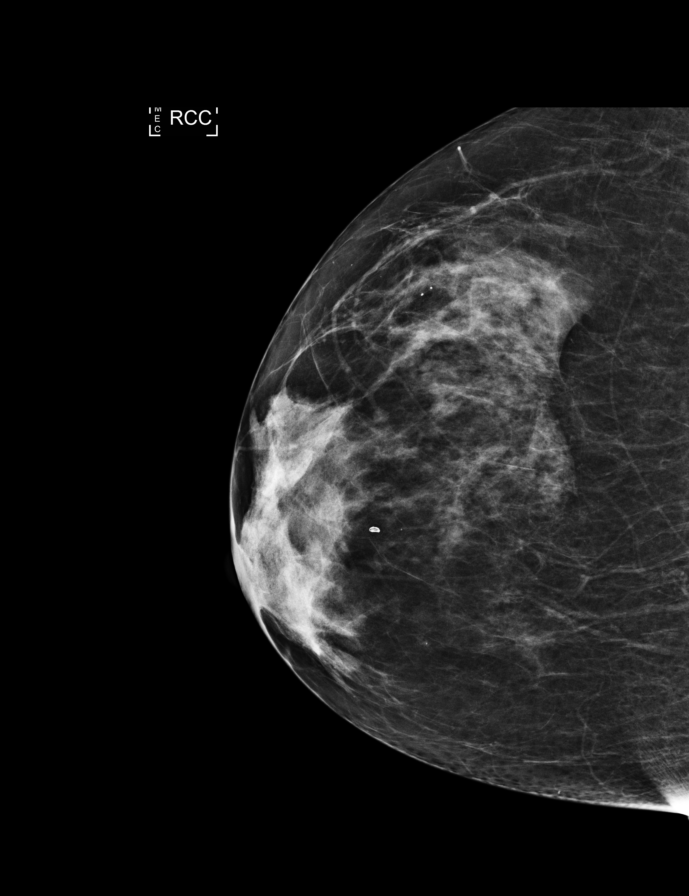

[R MLO synth-2D]
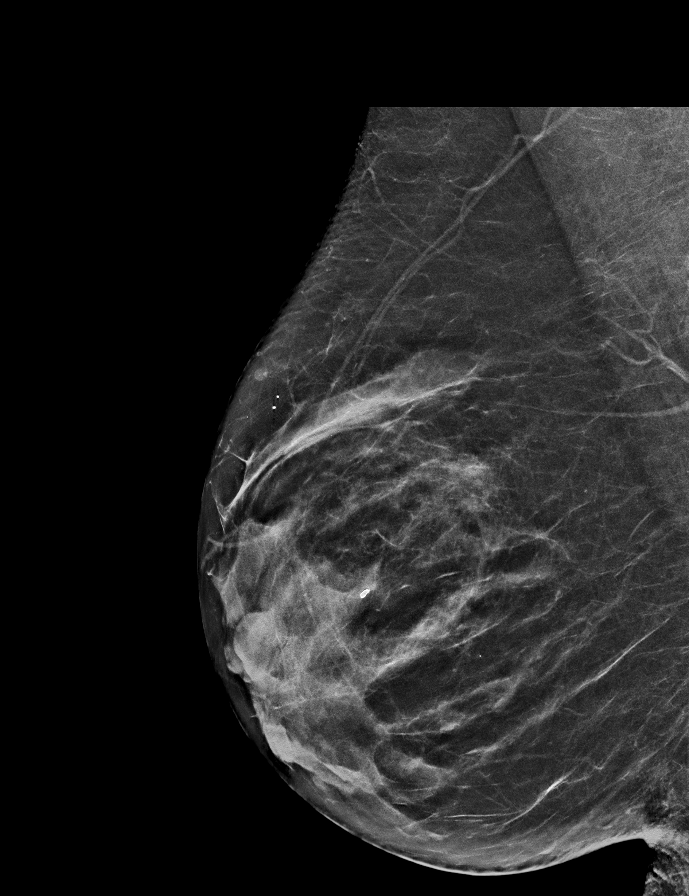

[R CC tomo · tomo slice 29/58.0]
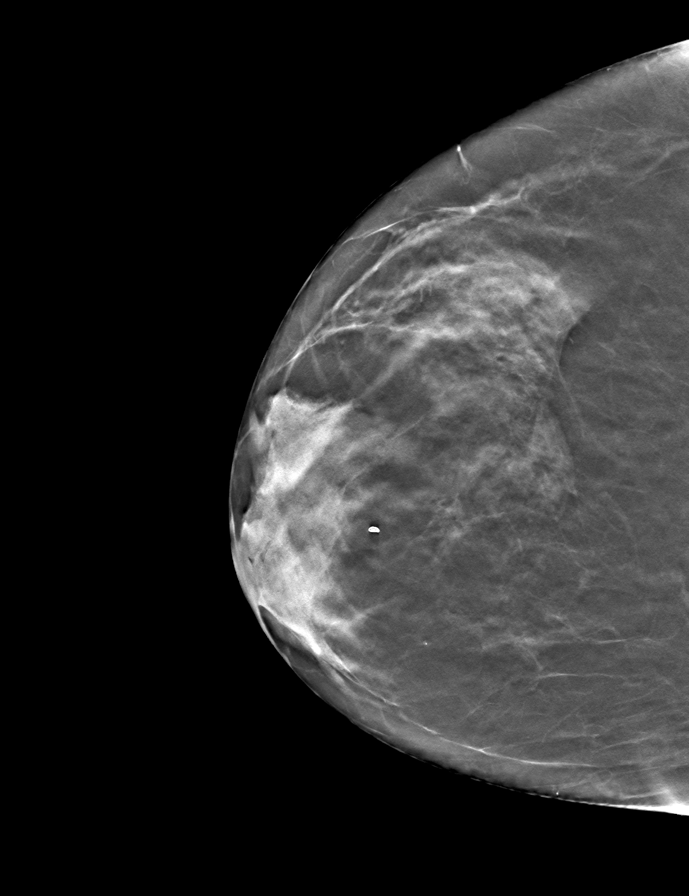

[9 of 28 positions shown; findings below may reference images not displayed]

ACR Breast Density Category c: The breast tissue is heterogeneously
dense, which may obscure small masses.
FINDINGS: There are no findings suspicious for malignancy. Images were
processed with CAD.
IMPRESSION: No mammographic evidence of malignancy. A result letter of this
screening mammogram will be mailed directly to the patient.

RECOMMENDATION:
Screening mammogram in one year. (Code:[id number])

BI-RADS CATEGORY  1: Negative.

## 2017-05-04 DIAGNOSIS — E039 Hypothyroidism, unspecified: Secondary | ICD-10-CM | POA: Diagnosis not present

## 2017-05-23 DIAGNOSIS — Z23 Encounter for immunization: Secondary | ICD-10-CM | POA: Diagnosis not present

## 2017-07-04 DIAGNOSIS — R351 Nocturia: Secondary | ICD-10-CM | POA: Diagnosis not present

## 2017-07-04 DIAGNOSIS — N8111 Cystocele, midline: Secondary | ICD-10-CM | POA: Diagnosis not present

## 2017-07-18 ENCOUNTER — Other Ambulatory Visit: Payer: Self-pay | Admitting: Obstetrics and Gynecology

## 2017-07-18 DIAGNOSIS — Z1231 Encounter for screening mammogram for malignant neoplasm of breast: Secondary | ICD-10-CM

## 2017-07-25 ENCOUNTER — Other Ambulatory Visit: Payer: Self-pay | Admitting: Urology

## 2017-08-16 ENCOUNTER — Other Ambulatory Visit: Payer: Self-pay | Admitting: Obstetrics and Gynecology

## 2017-08-18 NOTE — Patient Instructions (Signed)
Your procedure is scheduled on:  Thursday, Dec. 20, 2018  Enter through the [employer] [facility] at:  6:00 AM  Pick up the phone at the desk and dial [phone].  Call this number if you have problems the morning of surgery: [phone].  Remember: Do NOT eat food or drink after:  Midnight Wednesday  Take these medicines the morning of surgery with a SIP OF WATER:  Levothyroxine, Omeprazole, Pravastatin  Stop ALL herbal medications at this time  Do NOT smoke the day of surgery.  Do NOT wear jewelry (body piercing), metal hair clips/bobby pins, make-up, artifical eyelashes or nail polish. Do NOT wear lotions, powders, or perfumes.  You may wear deodorant. Do NOT shave for 48 hours prior to surgery. Do NOT bring valuables to the hospital. Contacts, dentures, or bridgework may not be worn into surgery.  Leave suitcase in car.  After surgery it may be brought to your room.  For patients admitted to the hospital, checkout time is 11:00 AM the day of discharge.  Bring a copy of your healthcare power of attorney and living will documents.  [place] - Preparing for Surgery Before surgery, you can play an important role.  Because skin is not sterile, your skin needs to be as free of germs as possible.  You can reduce the number of germs on your skin by washing with CHG (chlorahexidine gluconate) soap before surgery.  CHG is an antiseptic cleaner which kills germs and bonds with the skin to continue killing germs even after washing. Please DO NOT use if you have an allergy to CHG or antibacterial soaps.  If your skin becomes reddened/irritated stop using the CHG and inform your nurse when you arrive at Short Stay. Do not shave (including legs and underarms) for at least 48 hours prior to the first CHG shower.  You may shave your face/neck.  Please follow these instructions carefully:  1.  Shower with CHG Soap the night before surgery and the  morning of surgery.  2.  If you  choose to wash your hair, wash your hair first as usual with your normal  shampoo.  3.  After you shampoo, rinse your hair and body thoroughly to remove the shampoo.                             4.  Use CHG as you would any other liquid soap.  You can apply chg directly to the skin and wash.  Gently with a scrungie or clean washcloth.  5.  Apply the CHG Soap to your body ONLY FROM THE NECK DOWN.   Do   not use on face/ open                           Wound or open sores. Avoid contact with eyes, ears mouth and   genitals (private parts).                       Wash face,  Genitals (private parts) with your normal soap.             6.  Wash thoroughly, paying special attention to the area where your    surgery  will be performed.  7.  Thoroughly rinse your body with warm water from the neck down.  8.  DO NOT shower/wash with your normal soap after using and  rinsing off the CHG Soap.                9.  Pat yourself dry with a clean towel.            10.  Wear clean pajamas.            11.  Place clean sheets on your bed the night of your first shower and do not  sleep with pets. Day of Surgery : Do not apply any lotions/deodorants the morning of surgery.  Please wear clean clothes to the hospital/surgery center.  FAILURE TO FOLLOW THESE INSTRUCTIONS MAY RESULT IN THE CANCELLATION OF YOUR SURGERY  PATIENT SIGNATURE_________________________________  NURSE SIGNATURE__________________________________  ________________________________________________________________________   Adam Phenix  An incentive spirometer is a tool that can help keep your lungs clear and active. This tool measures how well you are filling your lungs with each breath. Taking long deep breaths may help reverse or decrease the chance of developing breathing (pulmonary) problems (especially infection) following:  A long period of time when you are unable to move or be active. BEFORE THE PROCEDURE   If the spirometer  includes an indicator to show your best effort, your nurse or respiratory therapist will set it to a desired goal.  If possible, sit up straight or lean slightly forward. Try not to slouch.  Hold the incentive spirometer in an upright position. INSTRUCTIONS FOR USE  1. Sit on the edge of your bed if possible, or sit up as far as you can in bed or on a chair. 2. Hold the incentive spirometer in an upright position. 3. Breathe out normally. 4. Place the mouthpiece in your mouth and seal your lips tightly around it. 5. Breathe in slowly and as deeply as possible, raising the piston or the ball toward the top of the column. 6. Hold your breath for 3-5 seconds or for as long as possible. Allow the piston or ball to fall to the bottom of the column. 7. Remove the mouthpiece from your mouth and breathe out normally. 8. Rest for a few seconds and repeat Steps 1 through 7 at least 10 times every 1-2 hours when you are awake. Take your time and take a few normal breaths between deep breaths. 9. The spirometer may include an indicator to show your best effort. Use the indicator as a goal to work toward during each repetition. 10. After each set of 10 deep breaths, practice coughing to be sure your lungs are clear. If you have an incision (the cut made at the time of surgery), support your incision when coughing by placing a pillow or rolled up towels firmly against it. Once you are able to get out of bed, walk around indoors and cough well. You may stop using the incentive spirometer when instructed by your caregiver.  RISKS AND COMPLICATIONS  Take your time so you do not get dizzy or light-headed.  If you are in pain, you may need to take or ask for pain medication before doing incentive spirometry. It is harder to take a deep breath if you are having pain. AFTER USE  Rest and breathe slowly and easily.  It can be helpful to keep track of a log of your progress. Your caregiver can provide you with a  simple table to help with this. If you are using the spirometer at home, follow these instructions: [place] IF:   You are having difficultly using the spirometer.  You have trouble using  the spirometer as often as instructed.  Your pain medication is not giving enough relief while using the spirometer.  You develop fever of 100.5 F (38.1 C) or higher. SEEK IMMEDIATE MEDICAL CARE IF:   You cough up bloody sputum that had not been present before.  You develop fever of 102 F (38.9 C) or greater.  You develop worsening pain at or near the incision site. MAKE SURE YOU:   Understand these instructions.  Will watch your condition.  Will get help right away if you are not doing well or get worse. Document Released: 01/10/2007 Document Revised: 11/22/2011 Document Reviewed: 03/13/2007 [employer] Patient Information 2014 [employer], [place].   ________________________________________________________________________  WHAT IS A BLOOD TRANSFUSION? Blood Transfusion Information  A transfusion is the replacement of blood or some of its parts. Blood is made up of multiple cells which provide different functions.  Red blood cells carry oxygen and are used for blood loss replacement.  White blood cells fight against infection.  Platelets control bleeding.  Plasma helps clot blood.  Other blood products are available for specialized needs, such as hemophilia or other clotting disorders. BEFORE THE TRANSFUSION  Who gives blood for transfusions?   Healthy volunteers who are fully evaluated to make sure their blood is safe. This is blood bank blood. Transfusion therapy is the safest it has ever been in the practice of medicine. Before blood is taken from a donor, a complete history is taken to make sure that person has no history of diseases nor engages in risky social behavior (examples are intravenous drug use or sexual activity with multiple partners). The donor's travel history  is screened to minimize risk of transmitting infections, such as malaria. The donated blood is tested for signs of infectious diseases, such as HIV and hepatitis. The blood is then tested to be sure it is compatible with you in order to minimize the chance of a transfusion reaction. If you or a relative donates blood, this is often done in anticipation of surgery and is not appropriate for emergency situations. It takes many days to process the donated blood. RISKS AND COMPLICATIONS Although transfusion therapy is very safe and saves many lives, the main dangers of transfusion include:   Getting an infectious disease.  Developing a transfusion reaction. This is an allergic reaction to something in the blood you were given. Every precaution is taken to prevent this. The decision to have a blood transfusion has been considered carefully by your caregiver before blood is given. Blood is not given unless the benefits outweigh the risks. AFTER THE TRANSFUSION  Right after receiving a blood transfusion, you will usually feel much better and more energetic. This is especially true if your red blood cells have gotten low (anemic). The transfusion raises the level of the red blood cells which carry oxygen, and this usually causes an energy increase.  The nurse administering the transfusion will monitor you carefully for complications. HOME CARE INSTRUCTIONS  No special instructions are needed after a transfusion. You may find your energy is better. Speak with your caregiver about any limitations on activity for underlying diseases you may have. SEEK MEDICAL CARE IF:   Your condition is not improving after your transfusion.  You develop redness or irritation at the intravenous (IV) site. SEEK IMMEDIATE MEDICAL CARE IF:  Any of the following symptoms occur over the next 12 hours:  Shaking chills.  You have a temperature by mouth above 102 F (38.9 C), not controlled by medicine.  Chest,  back, or  muscle pain.  People around you feel you are not acting correctly or are confused.  Shortness of breath or difficulty breathing.  Dizziness and fainting.  You get a rash or develop hives.  You have a decrease in urine output.  Your urine turns a dark color or changes to pink, red, or brown. Any of the following symptoms occur over the next 10 days:  You have a temperature by mouth above 102 F (38.9 C), not controlled by medicine.  Shortness of breath.  Weakness after normal activity.  The white part of the eye turns yellow (jaundice).  You have a decrease in the amount of urine or are urinating less often.  Your urine turns a dark color or changes to pink, red, or brown. Document Released: 08/27/2000 Document Revised: 11/22/2011 Document Reviewed: 04/15/2008 [facility] Patient Information 2014 [place], [place].  _______________________________________________________________________

## 2017-08-23 DIAGNOSIS — B373 Candidiasis of vulva and vagina: Secondary | ICD-10-CM | POA: Diagnosis not present

## 2017-08-24 ENCOUNTER — Encounter (HOSPITAL_COMMUNITY)
Admission: RE | Admit: 2017-08-24 | Discharge: 2017-08-24 | Disposition: A | Discharge status: Home / Self Care | Payer: Medicare Other | Source: Ambulatory Visit | Attending: Urology | Admitting: Urology

## 2017-08-24 ENCOUNTER — Encounter (HOSPITAL_COMMUNITY): Payer: Self-pay

## 2017-08-24 ENCOUNTER — Other Ambulatory Visit: Payer: Self-pay

## 2017-08-24 DIAGNOSIS — Z01810 Encounter for preprocedural cardiovascular examination: Secondary | ICD-10-CM | POA: Diagnosis not present

## 2017-08-24 DIAGNOSIS — N8110 Cystocele, unspecified: Secondary | ICD-10-CM | POA: Diagnosis not present

## 2017-08-24 DIAGNOSIS — Z01812 Encounter for preprocedural laboratory examination: Secondary | ICD-10-CM | POA: Insufficient documentation

## 2017-08-24 LAB — TYPE AND SCREEN
ABO/RH(D): A POS
Antibody Screen: NEGATIVE

## 2017-08-24 LAB — BASIC METABOLIC PANEL
ANION GAP: 8 (ref 5–15)
BUN: 17 mg/dL (ref 6–20)
CALCIUM: 9.4 mg/dL (ref 8.9–10.3)
CO2: 27 mmol/L (ref 22–32)
CREATININE: 0.63 mg/dL (ref 0.44–1.00)
Chloride: 101 mmol/L (ref 101–111)
GLUCOSE: 90 mg/dL (ref 65–99)
Potassium: 4 mmol/L (ref 3.5–5.1)
Sodium: 136 mmol/L (ref 135–145)

## 2017-08-24 LAB — PROTIME-INR
INR: 0.97
PROTHROMBIN TIME: 12.8 s (ref 11.4–15.2)

## 2017-08-24 LAB — CBC
HCT: 41.2 % (ref 36.0–46.0)
HEMOGLOBIN: 13.6 g/dL (ref 12.0–15.0)
MCH: 31.6 pg (ref 26.0–34.0)
MCHC: 33 g/dL (ref 30.0–36.0)
MCV: 95.8 fL (ref 78.0–100.0)
PLATELETS: 247 10*3/uL (ref 150–400)
RBC: 4.3 MIL/uL (ref 3.87–5.11)
RDW: 13.4 % (ref 11.5–15.5)
WBC: 6.5 10*3/uL (ref 4.0–10.5)

## 2017-08-24 LAB — ABO/RH: ABO/RH(D): A POS

## 2017-08-24 NOTE — Patient Instructions (Signed)
Your procedure is scheduled on:  Thursday, Dec. 20, 2018  Enter through the [employer] of [facility] at:  6:00 AM  Pick up the phone at the desk and dial [phone].  Call this number if you have problems the morning of surgery: [phone].  Remember: Do NOT eat food or drink after:  Midnight Wednesday  Take these medicines the morning of surgery with a SIP OF WATER:  Levothyroxine, Pepcid  Stop ALL herbal medications at this time  Do NOT smoke the day of surgery.  Do NOT wear jewelry (body piercing), metal hair clips/bobby pins, make-up, artifical eyelashes or nail polish. Do NOT wear lotions, powders, or perfumes.  You may wear deodorant. Do NOT shave for 48 hours prior to surgery. Do NOT bring valuables to the hospital. Contacts, dentures, or bridgework may not be worn into surgery.  Leave suitcase in car.  After surgery it may be brought to your room.  For patients admitted to the hospital, checkout time is 11:00 AM the day of discharge.  Bring a copy of your healthcare power of attorney and living will documents.   - Preparing for Surgery Before surgery, you can play an important role.  Because skin is not sterile, your skin needs to be as free of germs as possible.  You can reduce the number of germs on your skin by washing with CHG (chlorahexidine gluconate) soap before surgery.  CHG is an antiseptic cleaner which kills germs and bonds with the skin to continue killing germs even after washing. Please DO NOT use if you have an allergy to CHG or antibacterial soaps.  If your skin becomes reddened/irritated stop using the CHG and inform your nurse when you arrive at Short Stay. Do not shave (including legs and underarms) for at least 48 hours prior to the first CHG shower.  You may shave your face/neck.  Please follow these instructions carefully:  1.  Shower with CHG Soap the night before surgery and the  morning of surgery.  2.  If you choose to wash your  hair, wash your hair first as usual with your normal  shampoo.  3.  After you shampoo, rinse your hair and body thoroughly to remove the shampoo.                             4.  Use CHG as you would any other liquid soap.  You can apply chg directly to the skin and wash.  Gently with a scrungie or clean washcloth.  5.  Apply the CHG Soap to your body ONLY FROM THE NECK DOWN.   Do   not use on face/ open                           Wound or open sores. Avoid contact with eyes, ears mouth and   genitals (private parts).                       Wash face,  Genitals (private parts) with your normal soap.             6.  Wash thoroughly, paying special attention to the area where your    surgery  will be performed.  7.  Thoroughly rinse your body with warm water from the neck down.  8.  DO NOT shower/wash with your normal soap after using and rinsing  off the CHG Soap.                9.  Pat yourself dry with a clean towel.            10.  Wear clean pajamas.            11.  Place clean sheets on your bed the night of your first shower and do not  sleep with pets. Day of Surgery : Do not apply any lotions/deodorants the morning of surgery.  Please wear clean clothes to the hospital/surgery center.  FAILURE TO FOLLOW THESE INSTRUCTIONS MAY RESULT IN THE CANCELLATION OF YOUR SURGERY  PATIENT SIGNATURE_________________________________  NURSE SIGNATURE__________________________________  ________________________________________________________________________   Kelsey Bradford  An incentive spirometer is a tool that can help keep your lungs clear and active. This tool measures how well you are filling your lungs with each breath. Taking long deep breaths may help reverse or decrease the chance of developing breathing (pulmonary) problems (especially infection) following:  A long period of time when you are unable to move or be active. BEFORE THE PROCEDURE   If the spirometer includes an  indicator to show your best effort, your nurse or respiratory therapist will set it to a desired goal.  If possible, sit up straight or lean slightly forward. Try not to slouch.  Hold the incentive spirometer in an upright position. INSTRUCTIONS FOR USE  1. Sit on the edge of your bed if possible, or sit up as far as you can in bed or on a chair. 2. Hold the incentive spirometer in an upright position. 3. Breathe out normally. 4. Place the mouthpiece in your mouth and seal your lips tightly around it. 5. Breathe in slowly and as deeply as possible, raising the piston or the ball toward the top of the column. 6. Hold your breath for 3-5 seconds or for as long as possible. Allow the piston or ball to fall to the bottom of the column. 7. Remove the mouthpiece from your mouth and breathe out normally. 8. Rest for a few seconds and repeat Steps 1 through 7 at least 10 times every 1-2 hours when you are awake. Take your time and take a few normal breaths between deep breaths. 9. The spirometer may include an indicator to show your best effort. Use the indicator as a goal to work toward during each repetition. 10. After each set of 10 deep breaths, practice coughing to be sure your lungs are clear. If you have an incision (the cut made at the time of surgery), support your incision when coughing by placing a pillow or rolled up towels firmly against it. Once you are able to get out of bed, walk around indoors and cough well. You may stop using the incentive spirometer when instructed by your caregiver.  RISKS AND COMPLICATIONS  Take your time so you do not get dizzy or light-headed.  If you are in pain, you may need to take or ask for pain medication before doing incentive spirometry. It is harder to take a deep breath if you are having pain. AFTER USE  Rest and breathe slowly and easily.  It can be helpful to keep track of a log of your progress. Your caregiver can provide you with a simple table  to help with this. If you are using the spirometer at home, follow these instructions: Kelsey Bradford IF:   You are having difficultly using the spirometer.  You have trouble using the  spirometer as often as instructed.  Your pain medication is not giving enough relief while using the spirometer.  You develop fever of 100.5 F (38.1 C) or higher. SEEK IMMEDIATE MEDICAL CARE IF:   You cough up bloody sputum that had not been present before.  You develop fever of 102 F (38.9 C) or greater.  You develop worsening pain at or near the incision site. MAKE SURE YOU:   Understand these instructions.  Will watch your condition.  Will get help right away if you are not doing well or get worse. Document Released: 01/10/2007 Document Revised: 11/22/2011 Document Reviewed: 03/13/2007 Exit[employer] Patient Information 2014 [employer], [place].   ________________________________________________________________________  WHAT IS A BLOOD TRANSFUSION? Blood Transfusion Information  A transfusion is the replacement of blood or some of its parts. Blood is made up of multiple cells which provide different functions.  Red blood cells carry oxygen and are used for blood loss replacement.  White blood cells fight against infection.  Platelets control bleeding.  Plasma helps clot blood.  Other blood products are available for specialized needs, such as hemophilia or other clotting disorders. BEFORE THE TRANSFUSION  Who gives blood for transfusions?   Healthy volunteers who are fully evaluated to make sure their blood is safe. This is blood bank blood. Transfusion therapy is the safest it has ever been in the practice of medicine. Before blood is taken from a donor, a complete history is taken to make sure that person has no history of diseases nor engages in risky social behavior (examples are intravenous drug use or sexual activity with multiple partners). The donor's travel history is screened  to minimize risk of transmitting infections, such as malaria. The donated blood is tested for signs of infectious diseases, such as HIV and hepatitis. The blood is then tested to be sure it is compatible with you in order to minimize the chance of a transfusion reaction. If you or a relative donates blood, this is often done in anticipation of surgery and is not appropriate for emergency situations. It takes many days to process the donated blood. RISKS AND COMPLICATIONS Although transfusion therapy is very safe and saves many lives, the main dangers of transfusion include:   Getting an infectious disease.  Developing a transfusion reaction. This is an allergic reaction to something in the blood you were given. Every precaution is taken to prevent this. The decision to have a blood transfusion has been considered carefully by your caregiver before blood is given. Blood is not given unless the benefits outweigh the risks. AFTER THE TRANSFUSION  Right after receiving a blood transfusion, you will usually feel much better and more energetic. This is especially true if your red blood cells have gotten low (anemic). The transfusion raises the level of the red blood cells which carry oxygen, and this usually causes an energy increase.  The nurse administering the transfusion will monitor you carefully for complications. HOME CARE INSTRUCTIONS  No special instructions are needed after a transfusion. You may find your energy is better. Speak with your caregiver about any limitations on activity for underlying diseases you may have. SEEK MEDICAL CARE IF:   Your condition is not improving after your transfusion.  You develop redness or irritation at the intravenous (IV) site. SEEK IMMEDIATE MEDICAL CARE IF:  Any of the following symptoms occur over the next 12 hours:  Shaking chills.  You have a temperature by mouth above 102 F (38.9 C), not controlled by medicine.  Chest, back,  or muscle  pain.  People around you feel you are not acting correctly or are confused.  Shortness of breath or difficulty breathing.  Dizziness and fainting.  You get a rash or develop hives.  You have a decrease in urine output.  Your urine turns a dark color or changes to pink, red, or brown. Any of the following symptoms occur over the next 10 days:  You have a temperature by mouth above 102 F (38.9 C), not controlled by medicine.  Shortness of breath.  Weakness after normal activity.  The white part of the eye turns yellow (jaundice).  You have a decrease in the amount of urine or are urinating less often.  Your urine turns a dark color or changes to pink, red, or brown. Document Released: 08/27/2000 Document Revised: 11/22/2011 Document Reviewed: 04/15/2008 [facility] Patient Information 2014 [place], [place].  _______________________________________________________________________

## 2017-08-26 ENCOUNTER — Ambulatory Visit
Admission: RE | Admit: 2017-08-26 | Discharge: 2017-08-26 | Disposition: A | Discharge status: Home / Self Care | Payer: Medicare Other | Source: Ambulatory Visit | Attending: Obstetrics and Gynecology | Admitting: Obstetrics and Gynecology

## 2017-08-26 DIAGNOSIS — Z1231 Encounter for screening mammogram for malignant neoplasm of breast: Secondary | ICD-10-CM | POA: Diagnosis not present

## 2017-08-28 DIAGNOSIS — N8110 Cystocele, unspecified: Secondary | ICD-10-CM | POA: Diagnosis not present

## 2017-08-28 DIAGNOSIS — Z01810 Encounter for preprocedural cardiovascular examination: Secondary | ICD-10-CM | POA: Diagnosis not present

## 2017-08-28 DIAGNOSIS — Z01812 Encounter for preprocedural laboratory examination: Secondary | ICD-10-CM | POA: Diagnosis not present

## 2017-08-29 ENCOUNTER — Ambulatory Visit: Payer: Medicare Other

## 2017-08-31 NOTE — H&P (Signed)
I was consulted by the above provider for the patient's prolapse symptoms worsening over a few months. She can feel vaginal bulging and it is a little bit uncomfortable to reduce it. She does not do splinting maneuvers and she has not had a hysterectomy.   She is continent and voids every 2-3 hours and gets up 1 or 2 times at night but does drink a lot of fluids. She reports a good flow and sometimes does not feel empty.   The patient had a mild or grade 3 cystocele with central defect. It was reached introitus. Her cervix distended approximately 2 cm. She had no rectocele. She had hyper mobility of the bladder neck and no stress incontinence.   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 I drew her a picture. She does report that the bladder exits the introitus at times and am highly suspect that her uterus descends further. If this is the case she likely best benefit from a transvaginal hysterectomy and cystocele repair and graft and vault suspension or fixation of the cuff to the utero sacral ligaments. The role of a pessary and urodynamics were discussed.   Today  Frequency and prolapse are stable  On urodynamics she empty efficiently. Her bladder capacity was 816 mL. The bladder was stable. Her bladder was a bit hyposensitive. She did not leak Donna Christen generating a Valsalva pressure 164 cm of water. This is with her without the prolapse reduced. During voluntary voiding she voided 650 mils an S4 50 mils per second voiding pressures 30 tumors water. EMG activity was normal during the voiding phase. Fluoroscopically she had significant prolapse. Her bladder neck descended 2-3 cm. The details of the urinalysis I dictated on urodynamics sheet   Picture was  drawn. We talked about a transvaginal hysterectomy with cystocele repair and graft avulsed suspension. Mesh issues described. New-onset of incontinence discussed with severity. I do not recommend a sling. She will see Dr. cousins and hopefully get a pessary for travel and have the surgery in the fall. She is sexually active. She will start probiotics several days prior to surgery and speak to her gastroenterologist because she has had Clostridium difficile the past. We both agreed that she would have preoperative IV antibiotics     ALLERGIES: None   MEDICATIONS: Levothyroxine Sodium  Famotidine  Pravastatin Sodium  Vitamin D2     GU PSH: Complex cystometrogram, w/ void pressure and urethral pressure profile studies, any technique - 10/21/2016 Complex Uroflow - 10/21/2016 Emg surf Electrd - 10/21/2016 Inject For cystogram - 10/21/2016 Intrabd voidng Press - 10/21/2016      PSH Notes: hip replacement  hernia repair   NON-GU PSH: None   GU PMH: Cystocele, midline - 10/07/2016 Incomplete bladder emptying - 10/07/2016 Nocturia - 10/07/2016    NON-GU PMH: Arthritis Hypercholesterolemia    FAMILY HISTORY: Diabetes - Father Heart Disease - Mother, Father stroke - Mother   SOCIAL HISTORY: Marital Status: Divorced Current Smoking Status: Patient has never smoked.   Tobacco Use Assessment Completed: Used Tobacco in last 30 days? Does not use smokeless tobacco. Has never drank.  Drinks 4+ caffeinated drinks per day. Has not had a blood transfusion.    REVIEW OF SYSTEMS:    GU Review  Female:   Patient denies frequent urination, hard to postpone urination, burning /pain with urination, get up at night to urinate, leakage of urine, stream starts and stops, trouble starting your stream, have to strain to urinate, and currently pregnant.  Gastrointestinal (Upper):   Patient denies nausea, vomiting, and indigestion/ heartburn.  Gastrointestinal (Lower):   Patient denies diarrhea and constipation.   Constitutional:   Patient denies fever, night sweats, weight loss, and fatigue.  Skin:   Patient denies skin rash/ lesion and itching.  Eyes:   Patient denies blurred vision and double vision.  Ears/ Nose/ Throat:   Patient denies sore throat and sinus problems.  Hematologic/Lymphatic:   Patient denies swollen glands and easy bruising.  Cardiovascular:   Patient denies leg swelling and chest pains.  Respiratory:   Patient denies cough and shortness of breath.  Endocrine:   Patient denies excessive thirst.  Musculoskeletal:   Patient denies back pain and joint pain.  Neurological:   Patient denies headaches and dizziness.  Psychologic:   Patient denies depression and anxiety.   VITAL SIGNS: None   PAST DATA REVIEWED:  Source Of History:  Patient   PROCEDURES:          Urinalysis Dipstick Dipstick Cont'd  Color: Straw Bilirubin: Neg  Appearance: Clear Ketones: Neg  Specific Gravity: 1.010 Blood: Neg  pH: 6.5 Protein: Neg  Glucose: Neg Urobilinogen: 0.2    Nitrites: Neg    Leukocyte Esterase: Neg    ASSESSMENT:      ICD-10 Details  1 GU:   Cystocele, midline - N81.11               Notes:   I drew her a picture and we talked about prolapse surgery in detail. Pros, cons, general surgical and anesthetic risks, and other options including behavioral therapy, pessaries, and watchful waiting were discussed. She understands that prolapse repairs are successful in 80-85% of cases for prolapse symptoms and can recur anteriorly, posteriorly, and/or apically. She understands that in most cases I use a graft and general risks were discussed. Surgical risks were described but not limited to the discussion of injury to neighboring structures including the bowel (with possible life-threatening sepsis and colostomy), bladder, urethra, vagina (all resulting in further surgery), and ureter (resulting in re-implantation). We talked about injury to nerves/soft tissue leading to debilitating and  intractable pelvic, abdominal, and lower extremity pain syndromes and neuropathies. The risks of buttock pain, intractable dyspareunia, and vaginal narrowing and shortening with sequelae were discussed. Bleeding risks, transfusion rates, and infection were discussed. The risk of persistent, de novo, or worsening bladder and/or bowel incontinence/dysfunction was discussed. The need for CIC was described as well the usual post-operative course. The patient understands that she might not reach her treatment goal and that she might be worse following surgery.    PLAN:           Schedule Return Visit/Planned Activity: Return PRN - Office Visit          Document Letter(s):  Created for Patient: Clinical Summary   After a thorough review of the management options for the patient's condition the patient  elected to proceed with surgical therapy as noted above. We have discussed the potential benefits and risks of the procedure, side effects of the proposed treatment, the likelihood of the patient achieving the goals of the procedure, and any potential problems that might occur during the procedure or recuperation. Informed consent has been obtained.

## 2017-09-01 ENCOUNTER — Ambulatory Visit (HOSPITAL_COMMUNITY): Payer: Medicare Other | Admitting: Anesthesiology

## 2017-09-01 ENCOUNTER — Ambulatory Visit (HOSPITAL_COMMUNITY)
Admission: RE | Admit: 2017-09-01 | Discharge: 2017-09-02 | Disposition: A | Discharge status: Home / Self Care | Payer: Medicare Other | Source: Ambulatory Visit | Attending: Urology | Admitting: Urology

## 2017-09-01 ENCOUNTER — Ambulatory Visit: Payer: Medicare Other

## 2017-09-01 ENCOUNTER — Other Ambulatory Visit: Payer: Self-pay

## 2017-09-01 ENCOUNTER — Encounter (HOSPITAL_COMMUNITY): Payer: Self-pay | Admitting: Anesthesiology

## 2017-09-01 ENCOUNTER — Encounter (HOSPITAL_COMMUNITY)
Admission: RE | Disposition: A | Discharge status: Home / Self Care | Payer: Self-pay | Source: Ambulatory Visit | Attending: Urology

## 2017-09-01 DIAGNOSIS — K219 Gastro-esophageal reflux disease without esophagitis: Secondary | ICD-10-CM | POA: Diagnosis not present

## 2017-09-01 DIAGNOSIS — N814 Uterovaginal prolapse, unspecified: Secondary | ICD-10-CM | POA: Diagnosis not present

## 2017-09-01 DIAGNOSIS — E039 Hypothyroidism, unspecified: Secondary | ICD-10-CM | POA: Insufficient documentation

## 2017-09-01 DIAGNOSIS — Z90710 Acquired absence of both cervix and uterus: Secondary | ICD-10-CM | POA: Diagnosis present

## 2017-09-01 DIAGNOSIS — D259 Leiomyoma of uterus, unspecified: Secondary | ICD-10-CM | POA: Insufficient documentation

## 2017-09-01 DIAGNOSIS — D62 Acute posthemorrhagic anemia: Secondary | ICD-10-CM | POA: Diagnosis not present

## 2017-09-01 DIAGNOSIS — D271 Benign neoplasm of left ovary: Secondary | ICD-10-CM | POA: Insufficient documentation

## 2017-09-01 DIAGNOSIS — Z79899 Other long term (current) drug therapy: Secondary | ICD-10-CM | POA: Insufficient documentation

## 2017-09-01 DIAGNOSIS — N8110 Cystocele, unspecified: Secondary | ICD-10-CM | POA: Diagnosis not present

## 2017-09-01 DIAGNOSIS — N83202 Unspecified ovarian cyst, left side: Secondary | ICD-10-CM | POA: Insufficient documentation

## 2017-09-01 DIAGNOSIS — E7800 Pure hypercholesterolemia, unspecified: Secondary | ICD-10-CM | POA: Insufficient documentation

## 2017-09-01 DIAGNOSIS — N8111 Cystocele, midline: Secondary | ICD-10-CM | POA: Diagnosis not present

## 2017-09-01 HISTORY — DX: Personal history of other infectious and parasitic diseases: Z86.19

## 2017-09-01 HISTORY — PX: VAGINAL PROLAPSE REPAIR: SHX830

## 2017-09-01 HISTORY — PX: LAPAROSCOPIC VAGINAL HYSTERECTOMY WITH SALPINGO OOPHORECTOMY: SHX6681

## 2017-09-01 HISTORY — PX: CYSTOSCOPY: SHX5120

## 2017-09-01 HISTORY — DX: Anemia, unspecified: D64.9

## 2017-09-01 HISTORY — DX: Personal history of other medical treatment: Z92.89

## 2017-09-01 HISTORY — PX: CYSTOCELE REPAIR: SHX163

## 2017-09-01 SURGERY — COLPORRHAPHY, ANTERIOR, FOR CYSTOCELE REPAIR
Anesthesia: General | Site: Vagina

## 2017-09-01 MED ORDER — KETOROLAC TROMETHAMINE 30 MG/ML IJ SOLN
30.0000 mg | Freq: Four times a day (QID) | INTRAMUSCULAR | Status: DC
  Administered 2017-09-01 – 2017-09-02 (×3): 30 mg via INTRAVENOUS
  Filled 2017-09-01 (×3): qty 1

## 2017-09-01 MED ORDER — BUPIVACAINE HCL (PF) 0.25 % IJ SOLN
INTRAMUSCULAR | Status: DC | PRN
  Administered 2017-09-01: 8 mL

## 2017-09-01 MED ORDER — FENTANYL CITRATE (PF) 100 MCG/2ML IJ SOLN
INTRAMUSCULAR | Status: AC
  Filled 2017-09-01: qty 2

## 2017-09-01 MED ORDER — ONDANSETRON HCL 4 MG PO TABS: 4.0000 mg | ORAL_TABLET | Freq: Four times a day (QID) | ORAL | Status: DC | PRN

## 2017-09-01 MED ORDER — DIPHENHYDRAMINE HCL 50 MG/ML IJ SOLN
INTRAMUSCULAR | Status: AC
  Filled 2017-09-01: qty 1

## 2017-09-01 MED ORDER — SCOPOLAMINE 1 MG/3DAYS TD PT72: 1.0000 | MEDICATED_PATCH | TRANSDERMAL | Status: DC

## 2017-09-01 MED ORDER — LIDOCAINE-EPINEPHRINE (PF) 1 %-1:200000 IJ SOLN
INTRAMUSCULAR | Status: AC
  Filled 2017-09-01: qty 30

## 2017-09-01 MED ORDER — ESTRADIOL 0.1 MG/GM VA CREA
TOPICAL_CREAM | VAGINAL | Status: DC | PRN
  Administered 2017-09-01: 1 via VAGINAL

## 2017-09-01 MED ORDER — SIMETHICONE 80 MG PO CHEW: 80.0000 mg | CHEWABLE_TABLET | Freq: Four times a day (QID) | ORAL | Status: DC | PRN

## 2017-09-01 MED ORDER — DEXTROSE IN LACTATED RINGERS 5 % IV SOLN
INTRAVENOUS | Status: DC
  Administered 2017-09-01 – 2017-09-02 (×2): via INTRAVENOUS

## 2017-09-01 MED ORDER — CEFAZOLIN SODIUM-DEXTROSE 2-3 GM-%(50ML) IV SOLR
INTRAVENOUS | Status: AC
  Filled 2017-09-01: qty 50

## 2017-09-01 MED ORDER — BUPIVACAINE HCL (PF) 0.25 % IJ SOLN
INTRAMUSCULAR | Status: AC
  Filled 2017-09-01: qty 30

## 2017-09-01 MED ORDER — MIDAZOLAM HCL 2 MG/2ML IJ SOLN
INTRAMUSCULAR | Status: AC
  Filled 2017-09-01: qty 2

## 2017-09-01 MED ORDER — FENTANYL CITRATE (PF) 250 MCG/5ML IJ SOLN
INTRAMUSCULAR | Status: AC
  Filled 2017-09-01: qty 5

## 2017-09-01 MED ORDER — GENTAMICIN SULFATE 40 MG/ML IJ SOLN
5.0000 mg/kg | INTRAVENOUS | Status: AC
  Administered 2017-09-01: 280 mg via INTRAVENOUS
  Filled 2017-09-01: qty 7

## 2017-09-01 MED ORDER — CEFAZOLIN SODIUM-DEXTROSE 2-4 GM/100ML-% IV SOLN
2.0000 g | INTRAVENOUS | Status: AC
  Administered 2017-09-01: 2 g via INTRAVENOUS

## 2017-09-01 MED ORDER — ONDANSETRON HCL 4 MG/2ML IJ SOLN
INTRAMUSCULAR | Status: AC
  Filled 2017-09-01: qty 2

## 2017-09-01 MED ORDER — KETOROLAC TROMETHAMINE 30 MG/ML IJ SOLN
INTRAMUSCULAR | Status: AC
  Filled 2017-09-01: qty 1

## 2017-09-01 MED ORDER — SODIUM CHLORIDE 0.9 % IR SOLN
Status: DC | PRN
  Administered 2017-09-01: 3000 mL

## 2017-09-01 MED ORDER — DEXAMETHASONE SODIUM PHOSPHATE 4 MG/ML IJ SOLN
INTRAMUSCULAR | Status: AC
  Filled 2017-09-01: qty 1

## 2017-09-01 MED ORDER — GLYCOPYRROLATE 0.2 MG/ML IJ SOLN
INTRAMUSCULAR | Status: AC
  Filled 2017-09-01: qty 1

## 2017-09-01 MED ORDER — IBUPROFEN 800 MG PO TABS
800.0000 mg | ORAL_TABLET | Freq: Three times a day (TID) | ORAL | Status: DC | PRN
  Filled 2017-09-01: qty 1

## 2017-09-01 MED ORDER — SCOPOLAMINE 1 MG/3DAYS TD PT72
MEDICATED_PATCH | TRANSDERMAL | Status: AC
  Administered 2017-09-01: 1.5 mg
  Filled 2017-09-01: qty 1

## 2017-09-01 MED ORDER — SODIUM CHLORIDE 0.9 % IV SOLN
Freq: Once | INTRAVENOUS | Status: AC
  Administered 2017-09-01: 500 mL
  Filled 2017-09-01: qty 500000

## 2017-09-01 MED ORDER — SODIUM CHLORIDE 0.9 % IJ SOLN
INTRAMUSCULAR | Status: AC
  Filled 2017-09-01: qty 10

## 2017-09-01 MED ORDER — DOCUSATE SODIUM 100 MG PO CAPS
100.0000 mg | ORAL_CAPSULE | Freq: Every day | ORAL | Status: DC
  Administered 2017-09-01 – 2017-09-02 (×2): 100 mg via ORAL
  Filled 2017-09-01 (×2): qty 1

## 2017-09-01 MED ORDER — PANTOPRAZOLE SODIUM 40 MG PO TBEC: 40.0000 mg | DELAYED_RELEASE_TABLET | Freq: Every day | ORAL | Status: DC

## 2017-09-01 MED ORDER — SUGAMMADEX SODIUM 200 MG/2ML IV SOLN
INTRAVENOUS | Status: AC
  Filled 2017-09-01: qty 2

## 2017-09-01 MED ORDER — ONDANSETRON HCL 4 MG/2ML IJ SOLN: 4.0000 mg | Freq: Four times a day (QID) | INTRAMUSCULAR | Status: DC | PRN

## 2017-09-01 MED ORDER — LIDOCAINE HCL (CARDIAC) 20 MG/ML IV SOLN
INTRAVENOUS | Status: DC | PRN
  Administered 2017-09-01: 50 mg via INTRAVENOUS
  Administered 2017-09-01: 20 mg via INTRAVENOUS

## 2017-09-01 MED ORDER — KETOROLAC TROMETHAMINE 30 MG/ML IJ SOLN: 30.0000 mg | Freq: Four times a day (QID) | INTRAMUSCULAR | Status: DC

## 2017-09-01 MED ORDER — ROCURONIUM BROMIDE 100 MG/10ML IV SOLN
INTRAVENOUS | Status: AC
  Filled 2017-09-01: qty 1

## 2017-09-01 MED ORDER — SUGAMMADEX SODIUM 200 MG/2ML IV SOLN
INTRAVENOUS | Status: DC | PRN
  Administered 2017-09-01: 112 mg via INTRAVENOUS

## 2017-09-01 MED ORDER — LIDOCAINE-EPINEPHRINE (PF) 1 %-1:200000 IJ SOLN
INTRAMUSCULAR | Status: DC | PRN
  Administered 2017-09-01: 30 mL
  Administered 2017-09-01: 19 mL

## 2017-09-01 MED ORDER — ARTIFICIAL TEARS OPHTHALMIC OINT
TOPICAL_OINTMENT | OPHTHALMIC | Status: AC
  Filled 2017-09-01: qty 3.5

## 2017-09-01 MED ORDER — OXYCODONE-ACETAMINOPHEN 5-325 MG PO TABS: 1.0000 | ORAL_TABLET | ORAL | Status: DC | PRN

## 2017-09-01 MED ORDER — ROCURONIUM BROMIDE 100 MG/10ML IV SOLN
INTRAVENOUS | Status: DC | PRN
  Administered 2017-09-01: 10 mg via INTRAVENOUS
  Administered 2017-09-01 (×2): 15 mg via INTRAVENOUS
  Administered 2017-09-01: 5 mg via INTRAVENOUS
  Administered 2017-09-01: 35 mg via INTRAVENOUS

## 2017-09-01 MED ORDER — PHENAZOPYRIDINE HCL 200 MG PO TABS
200.0000 mg | ORAL_TABLET | ORAL | Status: AC
  Administered 2017-09-01: 200 mg via ORAL
  Filled 2017-09-01: qty 1

## 2017-09-01 MED ORDER — FENTANYL CITRATE (PF) 100 MCG/2ML IJ SOLN
INTRAMUSCULAR | Status: DC | PRN
  Administered 2017-09-01 (×2): 50 ug via INTRAVENOUS
  Administered 2017-09-01 (×2): 25 ug via INTRAVENOUS
  Administered 2017-09-01 (×3): 50 ug via INTRAVENOUS

## 2017-09-01 MED ORDER — PROPOFOL 10 MG/ML IV BOLUS
INTRAVENOUS | Status: DC | PRN
  Administered 2017-09-01: 170 mg via INTRAVENOUS

## 2017-09-01 MED ORDER — EPHEDRINE 5 MG/ML INJ
INTRAVENOUS | Status: AC
  Filled 2017-09-01: qty 10

## 2017-09-01 MED ORDER — LEVOTHYROXINE SODIUM 50 MCG PO TABS
50.0000 ug | ORAL_TABLET | Freq: Every day | ORAL | Status: DC
  Administered 2017-09-02: 50 ug via ORAL
  Filled 2017-09-01: qty 1

## 2017-09-01 MED ORDER — FLUORESCEIN SODIUM 10 % IV SOLN
INTRAVENOUS | Status: AC
Start: 2017-09-01 — End: 2017-09-01
  Filled 2017-09-01: qty 5

## 2017-09-01 MED ORDER — ONDANSETRON HCL 4 MG/2ML IJ SOLN
INTRAMUSCULAR | Status: DC | PRN
  Administered 2017-09-01: 4 mg via INTRAVENOUS

## 2017-09-01 MED ORDER — EPHEDRINE SULFATE 50 MG/ML IJ SOLN
INTRAMUSCULAR | Status: DC | PRN
  Administered 2017-09-01: 15 mg via INTRAVENOUS
  Administered 2017-09-01 (×4): 10 mg via INTRAVENOUS

## 2017-09-01 MED ORDER — LACTATED RINGERS IV SOLN
INTRAVENOUS | Status: DC
  Administered 2017-09-01 (×3): via INTRAVENOUS

## 2017-09-01 MED ORDER — LIDOCAINE HCL (CARDIAC) 20 MG/ML IV SOLN
INTRAVENOUS | Status: AC
  Filled 2017-09-01: qty 5

## 2017-09-01 MED ORDER — PROPOFOL 10 MG/ML IV BOLUS
INTRAVENOUS | Status: AC
  Filled 2017-09-01: qty 20

## 2017-09-01 MED ORDER — DIPHENHYDRAMINE HCL 50 MG/ML IJ SOLN
INTRAMUSCULAR | Status: DC | PRN
  Administered 2017-09-01: 12.5 mg via INTRAVENOUS

## 2017-09-01 MED ORDER — DEXAMETHASONE SODIUM PHOSPHATE 10 MG/ML IJ SOLN
INTRAMUSCULAR | Status: DC | PRN
  Administered 2017-09-01: 4 mg via INTRAVENOUS

## 2017-09-01 MED ORDER — HYDROMORPHONE HCL 1 MG/ML IJ SOLN: 0.2000 mg | INTRAMUSCULAR | Status: DC | PRN

## 2017-09-01 MED ORDER — LIDOCAINE HCL 1 % IJ SOLN
INTRAMUSCULAR | Status: AC
  Filled 2017-09-01: qty 20

## 2017-09-01 MED ORDER — FAMOTIDINE 20 MG PO TABS
20.0000 mg | ORAL_TABLET | Freq: Every day | ORAL | Status: DC
  Administered 2017-09-02: 20 mg via ORAL
  Filled 2017-09-01: qty 1

## 2017-09-01 MED ORDER — MENTHOL 3 MG MT LOZG: 1.0000 | LOZENGE | OROMUCOSAL | Status: DC | PRN

## 2017-09-01 MED ORDER — GLYCOPYRROLATE 0.2 MG/ML IJ SOLN
INTRAMUSCULAR | Status: DC | PRN
  Administered 2017-09-01: 0.1 mg via INTRAVENOUS

## 2017-09-01 MED ORDER — KETOROLAC TROMETHAMINE 30 MG/ML IJ SOLN
INTRAMUSCULAR | Status: DC | PRN
  Administered 2017-09-01: 30 mg via INTRAVENOUS

## 2017-09-01 MED ORDER — ESTRADIOL 0.1 MG/GM VA CREA
TOPICAL_CREAM | VAGINAL | Status: AC
Start: 2017-09-01 — End: 2017-09-01
  Filled 2017-09-01: qty 42.5

## 2017-09-01 SURGICAL SUPPLY — 84 items
ALLOGRAFT TUTOPLAST AXIS 6X12 (Tissue) ×3 IMPLANT
APPLICATOR COTTON TIP 6IN STRL (MISCELLANEOUS) IMPLANT
BLADE SURG 15 STRL LF C SS BP (BLADE) ×3 IMPLANT
BLADE SURG 15 STRL SS (BLADE) ×1
CABLE HIGH FREQUENCY MONO STRZ (ELECTRODE) IMPLANT
CANISTER SUCT 3000ML PPV (MISCELLANEOUS) ×4 IMPLANT
CATH FOLEY 2WAY SLVR  5CC 16FR (CATHETERS) ×2
CATH FOLEY 2WAY SLVR 5CC 16FR (CATHETERS) ×6 IMPLANT
CATH ROBINSON RED A/P 16FR (CATHETERS) IMPLANT
CONT PATH 16OZ SNAP LID 3702 (MISCELLANEOUS) ×4 IMPLANT
COVER BACK TABLE 60X90IN (DRAPES) ×4 IMPLANT
COVER MAYO STAND STRL (DRAPES) ×4 IMPLANT
DECANTER SPIKE VIAL GLASS SM (MISCELLANEOUS) ×8 IMPLANT
DERMABOND ADVANCED (GAUZE/BANDAGES/DRESSINGS) ×2
DERMABOND ADVANCED .7 DNX12 (GAUZE/BANDAGES/DRESSINGS) ×6 IMPLANT
DEVICE CAPIO SLIM SINGLE (INSTRUMENTS) ×4 IMPLANT
DRAIN PENROSE 1/4X12 LTX (DRAIN) ×4 IMPLANT
DRAPE UNDERBUTTOCKS STRL (DRAPE) ×4 IMPLANT
DRSG OPSITE POSTOP 3X4 (GAUZE/BANDAGES/DRESSINGS) ×4 IMPLANT
DURAPREP 26ML APPLICATOR (WOUND CARE) ×4 IMPLANT
ELECT REM PT RETURN 9FT ADLT (ELECTROSURGICAL) ×4
ELECTRODE REM PT RTRN 9FT ADLT (ELECTROSURGICAL) ×3 IMPLANT
FORCEPS CUTTING 33CM 5MM (CUTTING FORCEPS) ×4 IMPLANT
GAUZE PACKING 1 X5 YD ST (GAUZE/BANDAGES/DRESSINGS) IMPLANT
GAUZE PACKING 2X5 YD STRL (GAUZE/BANDAGES/DRESSINGS) ×4 IMPLANT
GLOVE BIO SURGEON STRL SZ7.5 (GLOVE) ×8 IMPLANT
GLOVE BIO SURGEON STRL SZ8.5 (GLOVE) ×4 IMPLANT
GLOVE BIOGEL PI IND STRL 6.5 (GLOVE) ×3 IMPLANT
GLOVE BIOGEL PI IND STRL 7.0 (GLOVE) ×27 IMPLANT
GLOVE BIOGEL PI INDICATOR 6.5 (GLOVE) ×1
GLOVE BIOGEL PI INDICATOR 7.0 (GLOVE) ×9
GLOVE ECLIPSE 6.5 STRL STRAW (GLOVE) ×20 IMPLANT
LEGGING LITHOTOMY PAIR STRL (DRAPES) ×4 IMPLANT
LIGASURE IMPACT 36 18CM CVD LR (INSTRUMENTS) ×4 IMPLANT
NEEDLE HYPO 22GX1.5 SAFETY (NEEDLE) ×8 IMPLANT
NEEDLE INSUFFLATION 120MM (ENDOMECHANICALS) ×4 IMPLANT
NEEDLE MAYO CATGUT SZ4 (NEEDLE) ×4 IMPLANT
NEEDLE SPNL 22GX1.5 QUINCKE BK (NEEDLE) IMPLANT
NS IRRIG 1000ML POUR BTL (IV SOLUTION) ×8 IMPLANT
PACK LAVH (CUSTOM PROCEDURE TRAY) ×4 IMPLANT
PACK ROBOTIC GOWN (GOWN DISPOSABLE) ×4 IMPLANT
PACK TRENDGUARD 450 HYBRID PRO (MISCELLANEOUS) ×3 IMPLANT
PACK TRENDGUARD 600 HYBRD PROC (MISCELLANEOUS) IMPLANT
PACK VAGINAL WOMENS (CUSTOM PROCEDURE TRAY) IMPLANT
PENCIL BUTTON HOLSTER BLD 10FT (ELECTRODE) ×4 IMPLANT
PLUG CATH AND CAP STER (CATHETERS) ×4 IMPLANT
PROTECTOR NERVE ULNAR (MISCELLANEOUS) ×8 IMPLANT
RETRACTOR STAY HOOK 5MM (MISCELLANEOUS) ×8 IMPLANT
SCISSORS LAP 5X35 DISP (ENDOMECHANICALS) IMPLANT
SET CYSTO W/LG BORE CLAMP LF (SET/KITS/TRAYS/PACK) ×4 IMPLANT
SET IRRIG TUBING LAPAROSCOPIC (IRRIGATION / IRRIGATOR) ×4 IMPLANT
SHEET LAVH (DRAPES) ×4 IMPLANT
SLEEVE XCEL OPT CAN 5 100 (ENDOMECHANICALS) ×4 IMPLANT
SOLUTION ELECTROLUBE (MISCELLANEOUS) ×4 IMPLANT
SUT CAPIO ETHIBPND (SUTURE) ×8 IMPLANT
SUT SILK 2 0 PERMA HAND 18 BK (SUTURE) IMPLANT
SUT VIC AB 0 CT1 18XCR BRD8 (SUTURE) ×6 IMPLANT
SUT VIC AB 0 CT1 27 (SUTURE) ×2
SUT VIC AB 0 CT1 27XBRD ANBCTR (SUTURE) ×6 IMPLANT
SUT VIC AB 0 CT1 36 (SUTURE) ×12 IMPLANT
SUT VIC AB 0 CT1 8-18 (SUTURE) ×2
SUT VIC AB 0 CT2 27 (SUTURE) IMPLANT
SUT VIC AB 2-0 CT1 (SUTURE) ×4 IMPLANT
SUT VIC AB 2-0 CT1 27 (SUTURE)
SUT VIC AB 2-0 CT1 TAPERPNT 27 (SUTURE) IMPLANT
SUT VIC AB 2-0 SH 27 (SUTURE) ×2
SUT VIC AB 2-0 SH 27XBRD (SUTURE) ×6 IMPLANT
SUT VIC AB 3-0 PS2 18 (SUTURE)
SUT VIC AB 3-0 PS2 18XBRD (SUTURE) IMPLANT
SUT VIC AB 3-0 SH 27 (SUTURE)
SUT VIC AB 3-0 SH 27X BRD (SUTURE) IMPLANT
SUT VIC AB 4-0 PS2 27 (SUTURE) ×4 IMPLANT
SUT VICRYL 0 TIES 12 18 (SUTURE) ×4 IMPLANT
SUT VICRYL 0 UR6 27IN ABS (SUTURE) ×4 IMPLANT
SYR BULB IRRIGATION 50ML (SYRINGE) ×4 IMPLANT
TOWEL OR 17X24 6PK STRL BLUE (TOWEL DISPOSABLE) ×24 IMPLANT
TRAY FOLEY CATH SILVER 14FR (SET/KITS/TRAYS/PACK) ×4 IMPLANT
TRENDGUARD 450 HYBRID PRO PACK (MISCELLANEOUS) ×4
TRENDGUARD 600 HYBRID PROC PK (MISCELLANEOUS)
TROCAR OPTI TIP 5M 100M (ENDOMECHANICALS) ×4 IMPLANT
TROCAR XCEL DIL TIP R 11M (ENDOMECHANICALS) ×4 IMPLANT
TUBING NON-CON 1/4 X 20 CONN (TUBING) ×4 IMPLANT
TUTOPLAST AXIS 6X12 (Tissue) ×4 IMPLANT
WARMER LAPAROSCOPE (MISCELLANEOUS) ×4 IMPLANT

## 2017-09-01 NOTE — Anesthesia Preprocedure Evaluation (Signed)
Anesthesia Evaluation  Patient identified by MRN, date of birth, ID band Patient awake    Reviewed: Allergy & Precautions, NPO status , Patient's Chart, lab work & pertinent test results  Airway Mallampati: II  TM Distance: >3 FB Neck ROM: Full    Dental no notable dental hx.    Pulmonary neg pulmonary ROS,    Pulmonary exam normal breath sounds clear to auscultation       Cardiovascular negative cardio ROS Normal cardiovascular exam Rhythm:Regular Rate:Normal     Neuro/Psych negative neurological ROS  negative psych ROS   GI/Hepatic negative GI ROS, Neg liver ROS,   Endo/Other  negative endocrine ROSHypothyroidism   Renal/GU negative Renal ROS  negative genitourinary   Musculoskeletal negative musculoskeletal ROS (+)   Abdominal   Peds negative pediatric ROS (+)  Hematology negative hematology ROS (+)   Anesthesia Other Findings   Reproductive/Obstetrics negative OB ROS                             Anesthesia Physical Anesthesia Plan  ASA: II  Anesthesia Plan: General   Post-op Pain Management:    Induction: Intravenous  PONV Risk Score and Plan: 4 or greater and Treatment may vary due to age or medical condition, Ondansetron, Dexamethasone, Midazolam and Scopolamine patch - Pre-op  Airway Management Planned: Oral ETT  Additional Equipment:   Intra-op Plan:   Post-operative Plan: Extubation in OR  Informed Consent: I have reviewed the patients History and Physical, chart, labs and discussed the procedure including the risks, benefits and alternatives for the proposed anesthesia with the patient or authorized representative who has indicated his/her understanding and acceptance.   Dental advisory given  Plan Discussed with: CRNA  Anesthesia Plan Comments:         Anesthesia Quick Evaluation

## 2017-09-01 NOTE — Anesthesia Procedure Notes (Signed)
Procedure Name: Intubation Date/Time: 09/01/2017 7:49 AM Performed by: Tobin Chad, CRNA Pre-anesthesia Checklist: Emergency Drugs available, Suction available, Patient identified and Patient being monitored Patient Re-evaluated:Patient Re-evaluated prior to induction Oxygen Delivery Method: Circle system utilized and Simple face mask Preoxygenation: Pre-oxygenation with 100% oxygen Induction Type: Combination inhalational/ intravenous induction Ventilation: Mask ventilation without difficulty Laryngoscope Size: Mac and 3 Grade View: Grade II Tube type: Oral Tube size: 7.0 mm Number of attempts: 1 Placement Confirmation: ETT inserted through vocal cords under direct vision,  positive ETCO2 and breath sounds checked- equal and bilateral Secured at: 20 (com) cm Tube secured with: Tape Dental Injury: Teeth and Oropharynx as per pre-operative assessment

## 2017-09-01 NOTE — Transfer of Care (Signed)
Immediate Anesthesia Transfer of Care Note  Patient: Kelsey Hazard, MD  Procedure(s) Performed: ANTERIOR REPAIR (CYSTOCELE) (N/A Vagina ) VAGINAL VAULT SUSPENSION AND GRAFT (N/A Vagina ) CYSTOSCOPY (N/A Urethra) LAPAROSCOPIC ASSISTED VAGINAL HYSTERECTOMY WITH  BILATERAL SALPINGO OOPHORECTOMY (Bilateral Abdomen)  Patient Location: PACU  Anesthesia Type:General  Level of Consciousness: awake, sedated, drowsy and patient cooperative  Airway & Oxygen Therapy: Patient Spontanous Breathing and Patient connected to nasal cannula oxygen  Post-op Assessment: Report given to RN and Post -op Vital signs reviewed and stable  Post vital signs: Reviewed and stable  Last Vitals:  Vitals:   08/24/17 1248 09/01/17 0629  BP:  126/76  Pulse: 69 71  Resp: 14 16  Temp: 36.5 C (!) 36.2 C  SpO2: 97% 100%    Last Pain:  Vitals:   09/01/17 0629  TempSrc: Oral      Patients Stated Pain Goal: 3 (18/28/83 3744)  Complications: No apparent anesthesia complications

## 2017-09-01 NOTE — Progress Notes (Signed)
DR Garwin Brothers notified of torodol order and  Does not wish to change order

## 2017-09-01 NOTE — Brief Op Note (Signed)
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  PRE-OPERATIVE DIAGNOSIS:  Uterovaginal prolapse, cystocele  POST-OPERATIVE DIAGNOSIS:  same  PROCEDURE:  LAVH BSO  SURGEON:  Surgeon(s) and Role: Panel 1:    * MacDiarmid, Nicki Reaper, MD - Primary Panel 2:    * Servando Salina, MD - Primary  PHYSICIAN ASSISTANT:   ASSISTANTS: Artelia Laroche, CNM   ANESTHESIA:   general FINDINGS: atrophic vagina, cystocele, nl liver edge, atrophic ovaries with small right ovarian cysts, nl ureters, small uterus, atrophic round ligaments EBL:  85 mL   BLOOD ADMINISTERED:none  DRAINS: none   LOCAL MEDICATIONS USED:  MARCAINE     SPECIMEN:  Source of Specimen:  uterus with cervix, tubes and ovaries  DISPOSITION OF SPECIMEN:  PATHOLOGY  COUNTS:  YES  TOURNIQUET:  * No tourniquets in log *  DICTATION: .Other Dictation: Dictation Number [id number]  PLAN OF CARE: Admit for overnight observation  PATIENT DISPOSITION:  PACU - hemodynamically stable.   Delay start of Pharmacological VTE agent (>24hrs) due to surgical blood loss or risk of bleeding: no

## 2017-09-01 NOTE — Op Note (Signed)
Preoperative diagnosis: Cystocele and vault prolapse Postoperative diagnosis: Cystocele and vault prolapse Surgery: Cystocele repair and graft and vault prolapse repair and cystoscopy Surgeon Dr. Nicki Reaper Essynce Munsch  The patient consented the above procedure with the above diagnosis.  Gynecology had placed the legs in good position with the yellow fin stirrups.  A hysterectomy had been performed with laparoscopic assistance and the vaginal cuff was left open.  The posterior cuff was closed with running suture.  The ureteral sacral on the left was tagged and was reasonable in strength and it was very flimsy on the right side.  I agreed with Dr. cousins that the tissue was very elastic and weak and quite thin especially in the anterior dissection of the anterior repair  I instilled 20 cc of lidocaine epinephrine mixture.  I used my Allis clamp technique to make my T shaped anterior vaginal wall incision.  I was very careful because of the mobility flimsiness and thinness of her anterior tissues.  I mobilized very well at the apex and to the white line bilaterally.  She had a narrow pelvis.  She only had a mild anterior defect.  I was very careful to do a 2 layer anterior repair with 2-0 Vicryl not imbricating the bladder neck and not distorting the lateral anatomy and maintaining length.  I cystoscoped the patient.  There was excellent efflux bilaterally with a good cystocele repair and no bladder injury  With the bladder emptied I finger dissected along the correct plane to the spine bilaterally in the pelvis.  I mobilized soft tissue medially.  With a Capio device I placed 0 Ethibond one full fingerbreadth medial to each spine in a straight line between the spine.  I triple checked this position.  I did a digital rectal examination and there is no rectal injury or suture in the rectum  With my usual technique I placed a 0 Vicryl into the pelvic sidewall at the level of the urethrovesical angle.  I cut a 10  x 6 fascial graft in the shape of the trapezoid and sewed it in place tension free.  I removed an appropriate amount of the anterior vaginal wall mucosa and closed the anterior vaginal wall with running 2-0 Vicryl on a CT..  I closed the apex with 0 Vicryl on a CT1 needle from left apex to right and from the right apex to the left joining in the midline  The patient had excellent reduction of her prolapse.  She had excellent vaginal length with no narrowing.  She had a soft high grade 1 rectocele and she did not need a rectocele repair.  Leg position was good.  Blood loss was less than 50 mL.  There was very good urine output  Hopefully the procedure will reach the patient's treatment goal

## 2017-09-02 DIAGNOSIS — E039 Hypothyroidism, unspecified: Secondary | ICD-10-CM | POA: Diagnosis not present

## 2017-09-02 DIAGNOSIS — N8110 Cystocele, unspecified: Secondary | ICD-10-CM | POA: Diagnosis not present

## 2017-09-02 DIAGNOSIS — E7800 Pure hypercholesterolemia, unspecified: Secondary | ICD-10-CM | POA: Diagnosis not present

## 2017-09-02 DIAGNOSIS — N83202 Unspecified ovarian cyst, left side: Secondary | ICD-10-CM | POA: Diagnosis not present

## 2017-09-02 DIAGNOSIS — D271 Benign neoplasm of left ovary: Secondary | ICD-10-CM | POA: Diagnosis not present

## 2017-09-02 DIAGNOSIS — D259 Leiomyoma of uterus, unspecified: Secondary | ICD-10-CM | POA: Diagnosis not present

## 2017-09-02 LAB — BASIC METABOLIC PANEL
Anion gap: 7 (ref 5–15)
BUN: 9 mg/dL (ref 6–20)
CHLORIDE: 108 mmol/L (ref 101–111)
CO2: 25 mmol/L (ref 22–32)
CREATININE: 0.66 mg/dL (ref 0.44–1.00)
Calcium: 8.4 mg/dL — ABNORMAL LOW (ref 8.9–10.3)
Glucose, Bld: 90 mg/dL (ref 65–99)
POTASSIUM: 3.8 mmol/L (ref 3.5–5.1)
SODIUM: 140 mmol/L (ref 135–145)

## 2017-09-02 LAB — CBC
HCT: 31.9 % — ABNORMAL LOW (ref 36.0–46.0)
HEMOGLOBIN: 10.6 g/dL — AB (ref 12.0–15.0)
MCH: 30.9 pg (ref 26.0–34.0)
MCHC: 33.2 g/dL (ref 30.0–36.0)
MCV: 93 fL (ref 78.0–100.0)
PLATELETS: 194 10*3/uL (ref 150–400)
RBC: 3.43 MIL/uL — AB (ref 3.87–5.11)
RDW: 13.6 % (ref 11.5–15.5)
WBC: 7.7 10*3/uL (ref 4.0–10.5)

## 2017-09-02 MED ORDER — IBUPROFEN 800 MG PO TABS: 800.0000 mg | ORAL_TABLET | Freq: Three times a day (TID) | ORAL | 0 refills | Status: DC | PRN

## 2017-09-02 MED ORDER — OXYCODONE-ACETAMINOPHEN 5-325 MG PO TABS: 1.0000 | ORAL_TABLET | ORAL | 0 refills | Status: DC | PRN

## 2017-09-02 NOTE — Progress Notes (Signed)
Looks good Vitals normal Labs OK Foley and pack out Voiding Detailed post op

## 2017-09-02 NOTE — Op Note (Signed)
NAME:  Kelsey Bradford, Kelsey Bradford                      ACCOUNT NO.:  MEDICAL RECORD NO.:  [id number]  LOCATION:                                 FACILITY:  PHYSICIAN:  Servando Salina, M.D.    DATE OF BIRTH:  DATE OF PROCEDURE:  09/01/2017 DATE OF DISCHARGE:                              OPERATIVE REPORT   PREOPERATIVE DIAGNOSES:  Uterovaginal prolapse, cystocele.  PROCEDURES:  Laparoscopic-assisted vaginal hysterectomy, bilateral salpingo-oophorectomy.  POSTOPERATIVE DIAGNOSES:  Uterovaginal prolapse, cystocele.  ANESTHESIA:  General.  SURGEON:  Servando Salina, M.D.  ASSISTANT:  Artelia Laroche, C.N.M.  DESCRIPTION OF PROCEDURE:  Under adequate general anesthesia, the patient was placed in the dorsal lithotomy position.  She was sterilely prepped and draped in usual fashion.  An indwelling Foley catheter was sterilely placed.  Examination under anesthesia revealed an anteverted uterus.  No adnexal masses could be appreciated.  Bivalve speculum was placed in vagina.  Single-tooth tenaculum was placed on the anterior lip of the cervix.  An Acorn cannula was introduced into the cervical os and attached to tenaculum for manipulation of the uterus and a bivalve speculum was then removed.  Attention was then turned to the abdomen. Marcaine 0.25% was injected in the infraumbilical incision site. Infraumbilical incision was then made.  Veress needle was introduced and tested, opening pressure of 3 was noted.  3 L of CO2 was insufflated. Veress needle was then removed.  10 mm disposable trocar with sleeve was introduced in the abdomen without incident.  A lighted videolaparoscope was then inserted.  Entry into the abdomen without incident.  Panoramic inspection was notable for normal liver edge, omental adhesions to the anterior abdominal wall, and mobile uterus with very thin round ligaments.  A decision was then made to proceed.  Posterior cul-de-sac without any lesions.  Marcaine 0.25% was  injected in the right and left lower quadrants.  Incision was then made and 5 mm ports were placed under direct visualization.  The port particularly on the right was on lateral edge of a previous mesh repaired hernia site.  Using a probe, the pelvis was inspected.  Both ureters could be seen peristalsing.  Both ovaries were noted to be atrophic.  The one on the right had some small cystic lesion.  The procedure was started on the left side. Using the gyrus, the left tube and ovary being put on traction and the underlying mesosalpinx was serially clamped, cauterized, and cut until the lateral aspect of the uterus was reached.  The round ligament was encompassed in this process.  The bladder reflection was then tented and the bladder peritoneum was opened transversely with sharp dissection and displaced inferiorly.  The uterine vessels on the left were then cauterized, but not cut.  The same procedure was performed on the contralateral side after again confirming the ureters position.  Once that was done, the ports remained in place.  The instruments were removed and attention was then turned to the vagina.  The patient was placed in deep Trendelenburg position.  A weighted speculum was placed in the vagina.  Sims retractor was placed anteriorly.  The instruments on the cervix were removed  and replaced with Jacobson clamp on the anterior and posterior lip of the cervix.  A dilute solution of epinephrine and lidocaine was injected at the cervicovaginal junction. A circumferential incision was then made and the posterior cul-de-sac was then easily opened.  The posterior leaf of the vaginal cuff was oversewn with 0 Vicryl running lock stitch.  The weighted speculum was repositioned into the abdominal cavity.  The thin uterosacral ligaments were bilaterally clamped, cut, and suture ligated with 0 Vicryl suture. The anterior cul-de-sac was then opened with sharp dissection and using the LigaSure,  the remaining cardinal ligaments and uterine vessels bilaterally were serially clamped, cauterized, and cut until the uterus was detached bilaterally and removed with both tubes and ovaries in toto.  At that point, Dr. Matilde Sprang was called.  I then went back to the abdomen, where the ports were removed and incisions were closed using 4-0 Vicryl for the subcuticular closure and 0 Vicryl for the infraumbilical fascial stitch.  Please see his dictation for the remaining operative portion of her surgery.  SPECIMEN:  Uterus, cervix with tubes and ovaries sent to Pathology.  ESTIMATED BLOOD LOSS: 5 mL.  INTRAOPERATIVE FLUID:  1 L.  URINE OUTPUT:  200 mL, clear yellow urine.  COMPLICATIONS:  None.  The patient ultimately tolerated the procedure well, was transferred to the recovery room in stable condition.     Servando Salina, M.D.     [place]/[facility]  D:  09/01/2017  T:  09/01/2017  Job:  [id number]

## 2017-09-02 NOTE — Progress Notes (Signed)
Per dr Matilde Sprang, Madaline Brilliant to D/c with higher PVR, due to no discomfort and he feels will improve as time goes on today.

## 2017-09-02 NOTE — Discharge Summary (Signed)
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  Admit date: 09/01/2017 Discharge date: 09/02/2017  Admission Diagnoses: uterovaginal prolapse, cystocele, vaginal vault prolapse  Discharge Diagnoses: same Active Problems:   S/P laparoscopic assisted vaginal hysterectomy (LAVH) hypothyroidism Hypercholesterolemia  Discharged Condition: stable  Hospital Course: Pt underwent LAVHBSO, cytocele repair, vaginal vault suspension. Uncomplicated postoperative course  Consults: None  Significant Diagnostic Studies: labs:  CBC    Component Value Date/Time   WBC 7.7 09/02/2017 0504   RBC 3.43 (L) 09/02/2017 0504   HGB 10.6 (L) 09/02/2017 0504   HCT 31.9 (L) 09/02/2017 0504   PLT 194 09/02/2017 0504   MCV 93.0 09/02/2017 0504   MCH 30.9 09/02/2017 0504   MCHC 33.2 09/02/2017 0504   RDW 13.6 09/02/2017 0504   . BMET    Component Value Date/Time   NA 140 09/02/2017 0504   K 3.8 09/02/2017 0504   CL 108 09/02/2017 0504   CO2 25 09/02/2017 0504   GLUCOSE 90 09/02/2017 0504   BUN 9 09/02/2017 0504   CREATININE 0.66 09/02/2017 0504   CALCIUM 8.4 (L) 09/02/2017 0504   GFRNONAA >60 09/02/2017 0504   GFRAA >60 09/02/2017 0504     Treatments: surgery: LAVHBSO, cytocele, vaginal vault prolapse  Discharge Exam: Blood pressure (!) 91/55, pulse 68, temperature 99 F (37.2 C), temperature source Oral, resp. rate 16, height 5' 0.5" (1.537 m), weight 56 kg (123 lb 8 oz), SpO2 95 %. General appearance: alert, cooperative and no distress Resp: clear to auscultation bilaterally Cardio: regular rate and rhythm, S1, S2 normal, no murmur, click, rub or gallop GI: soft, non-tender; bowel sounds normal; no masses,  no organomegaly Pelvic: deferred Extremities: no edema, redness or tenderness in the calves or thighs Skin: Skin color, texture, turgor normal. No rashes or lesions Incision/Wound: RLQ ecchymosis, otherwise  d/c/i  Disposition: 01-Home or Self Care  Discharge Instructions    Call MD for:  severe uncontrolled pain   Complete by:  As directed    Call MD for:  temperature >100.4   Complete by:  As directed    Diet general   Complete by:  As directed    May walk up steps   Complete by:  As directed      Allergies as of 09/02/2017   No Known Allergies     Medication List    TAKE these medications   CALCIUM 600 PO Take 600 mg by mouth.   famotidine 20 MG tablet Commonly known as:  PEPCID Take 20 mg by mouth daily.   ibuprofen 800 MG tablet Commonly known as:  ADVIL,MOTRIN Take 1 tablet (800 mg total) by mouth every 8 (eight) hours as needed (mild pain).   levothyroxine 50 MCG tablet Commonly known as:  SYNTHROID, LEVOTHROID Take 50 mcg by mouth daily before breakfast.   loratadine 10 MG tablet Commonly known as:  CLARITIN Take 10 mg by mouth daily.   multivitamin tablet Take 1 tablet by mouth daily.   oxyCODONE-acetaminophen 5-325 MG tablet Commonly known as:  PERCOCET/ROXICET Take 1 tablet by mouth every 4 (four) hours as needed for moderate pain.   pravastatin 80 MG tablet Commonly known as:  PRAVACHOL Take 80 mg by mouth daily.   Vitamin D3 2000 units Tabs Take 2,000 Units by mouth.      Follow-up Information    Servando Salina, MD Follow up in 6 week(s).   Specialty:  Obstetrics and Gynecology Contact information: [address]  [place] [address] [phone]        Bjorn Loser, MD. Schedule an appointment as soon as possible for a visit.   Specialty:  Urology Contact information: [place] [address] [phone]           Signed: Marvene Staff 09/02/2017, 9:50 PM

## 2017-09-02 NOTE — Anesthesia Postprocedure Evaluation (Signed)
Anesthesia Post Note  Patient: Molli Hazard, MD  Procedure(s) Performed: ANTERIOR REPAIR (CYSTOCELE) (N/A Vagina ) VAGINAL VAULT SUSPENSION AND GRAFT (N/A Vagina ) CYSTOSCOPY (N/A Urethra) LAPAROSCOPIC ASSISTED VAGINAL HYSTERECTOMY WITH  BILATERAL SALPINGO OOPHORECTOMY (Bilateral Abdomen)     Anesthesia Type: General    Last Vitals:  Vitals:   09/02/17 0524 09/02/17 0744  BP: (!) 102/54 (!) 91/55  Pulse: 81 68  Resp: 16 16  Temp: 37.1 C 37.2 C  SpO2: 98% 95%    Last Pain:  Vitals:   09/02/17 0954  TempSrc:   PainSc: 0-No pain   Pain Goal: Patients Stated Pain Goal: 3 (09/01/17 1800)               Montez Hageman

## 2017-09-02 NOTE — Plan of Care (Signed)
Patient progressed well to d/c.  Voiding adequately to d/c per [facility]dermit

## 2017-09-02 NOTE — Progress Notes (Signed)
Subjective: Patient reports tolerating PO.    Objective: I have reviewed patient's vital signs.  vital signs, intake and output and labs. Vitals:   09/02/17 0524 09/02/17 0744  BP: (!) 102/54 (!) 91/55  Pulse: 81 68  Resp: 16 16  Temp: 98.8 F (37.1 C) 99 F (37.2 C)  SpO2: 98% 95%   I/O last 3 completed shifts: In: 2500 [P.O.:300; I.V.:2200] Out: 2360 [Urine:2275; Blood:85] Total I/O In: 1718.8 [I.V.:1718.8] Out: 200 [Urine:200]  Lab Results  Component Value Date   WBC 7.7 09/02/2017   HGB 10.6 (L) 09/02/2017   HCT 31.9 (L) 09/02/2017   MCV 93.0 09/02/2017   PLT 194 09/02/2017   Lab Results  Component Value Date   CREATININE 0.66 09/02/2017    EXAM General: alert, cooperative and no distress Resp: clear to auscultation bilaterally Cardio: regular rate and rhythm, S1, S2 normal, no murmur, click, rub or gallop GI: soft, non-tender; bowel sounds normal; no masses,  no organomegaly and incision: clean, dry, intact and rRLQ site with sl ecchymosis Extremities: no edema, redness or tenderness in the calves or thighs Vaginal Bleeding: none  Assessment: s/p Procedure(s): ANTERIOR REPAIR (CYSTOCELE) VAGINAL VAULT SUSPENSION AND GRAFT CYSTOSCOPY LAPAROSCOPIC ASSISTED VAGINAL HYSTERECTOMY WITH  BILATERAL SALPINGO OOPHORECTOMY: stable, progressing well and tolerating diet  Plan: Encourage ambulation Discontinue IV fluids Discharge home voiding trials per Dr Matilde Sprang  D/c instructions reviewed  f/u 6 weeks Dr Garwin Brothers F/u Dr McDiarmid per his instructions   LOS: 0 days    Marvene Staff, MD 09/02/2017 8:19 AM    09/02/2017, 8:19 AM

## 2017-09-02 NOTE — Discharge Instructions (Signed)
Call if temperature greater than equal to 100.4, nothing per vagina for 4-6 weeks or severe nausea vomiting, increased incisional pain , drainage or redness in the incision site, no straining with bowel movements, showers no bath °

## 2017-09-15 ENCOUNTER — Encounter (HOSPITAL_COMMUNITY): Payer: Self-pay | Admitting: Urology

## 2017-09-23 DIAGNOSIS — N8111 Cystocele, midline: Secondary | ICD-10-CM | POA: Diagnosis not present

## 2017-10-14 DIAGNOSIS — Z130 Encounter for screening for diseases of the blood and blood-forming organs and certain disorders involving the immune mechanism: Secondary | ICD-10-CM | POA: Diagnosis not present

## 2017-12-14 DIAGNOSIS — N8111 Cystocele, midline: Secondary | ICD-10-CM | POA: Diagnosis not present

## 2018-01-18 DIAGNOSIS — N8111 Cystocele, midline: Secondary | ICD-10-CM | POA: Diagnosis not present

## 2018-01-18 DIAGNOSIS — R351 Nocturia: Secondary | ICD-10-CM | POA: Diagnosis not present

## 2018-02-09 DIAGNOSIS — N8111 Cystocele, midline: Secondary | ICD-10-CM | POA: Diagnosis not present

## 2018-03-01 DIAGNOSIS — N8111 Cystocele, midline: Secondary | ICD-10-CM | POA: Diagnosis not present

## 2018-03-03 DIAGNOSIS — E7800 Pure hypercholesterolemia, unspecified: Secondary | ICD-10-CM | POA: Diagnosis not present

## 2018-03-03 DIAGNOSIS — E559 Vitamin D deficiency, unspecified: Secondary | ICD-10-CM | POA: Diagnosis not present

## 2018-03-03 DIAGNOSIS — E039 Hypothyroidism, unspecified: Secondary | ICD-10-CM | POA: Diagnosis not present

## 2018-03-09 DIAGNOSIS — Z8349 Family history of other endocrine, nutritional and metabolic diseases: Secondary | ICD-10-CM | POA: Diagnosis not present

## 2018-03-09 DIAGNOSIS — K219 Gastro-esophageal reflux disease without esophagitis: Secondary | ICD-10-CM | POA: Diagnosis not present

## 2018-03-09 DIAGNOSIS — Z0000 Encounter for general adult medical examination without abnormal findings: Secondary | ICD-10-CM | POA: Diagnosis not present

## 2018-03-09 DIAGNOSIS — Z833 Family history of diabetes mellitus: Secondary | ICD-10-CM | POA: Diagnosis not present

## 2018-03-09 DIAGNOSIS — E039 Hypothyroidism, unspecified: Secondary | ICD-10-CM | POA: Diagnosis not present

## 2018-03-09 DIAGNOSIS — E559 Vitamin D deficiency, unspecified: Secondary | ICD-10-CM | POA: Diagnosis not present

## 2018-03-09 DIAGNOSIS — M8580 Other specified disorders of bone density and structure, unspecified site: Secondary | ICD-10-CM | POA: Diagnosis not present

## 2018-03-09 DIAGNOSIS — E7800 Pure hypercholesterolemia, unspecified: Secondary | ICD-10-CM | POA: Diagnosis not present

## 2018-03-21 DIAGNOSIS — Z1211 Encounter for screening for malignant neoplasm of colon: Secondary | ICD-10-CM | POA: Diagnosis not present

## 2018-03-21 DIAGNOSIS — N8111 Cystocele, midline: Secondary | ICD-10-CM | POA: Diagnosis not present

## 2018-03-21 DIAGNOSIS — Z1212 Encounter for screening for malignant neoplasm of rectum: Secondary | ICD-10-CM | POA: Diagnosis not present

## 2018-05-04 DIAGNOSIS — N8111 Cystocele, midline: Secondary | ICD-10-CM | POA: Diagnosis not present

## 2018-05-04 DIAGNOSIS — R351 Nocturia: Secondary | ICD-10-CM | POA: Diagnosis not present

## 2018-05-14 DIAGNOSIS — Z23 Encounter for immunization: Secondary | ICD-10-CM | POA: Diagnosis not present

## 2018-07-13 ENCOUNTER — Other Ambulatory Visit: Payer: Self-pay | Admitting: Obstetrics and Gynecology

## 2018-07-13 DIAGNOSIS — Z1231 Encounter for screening mammogram for malignant neoplasm of breast: Secondary | ICD-10-CM

## 2018-07-25 DIAGNOSIS — H2513 Age-related nuclear cataract, bilateral: Secondary | ICD-10-CM | POA: Diagnosis not present

## 2018-08-31 ENCOUNTER — Ambulatory Visit: Payer: Medicare Other

## 2018-08-31 ENCOUNTER — Ambulatory Visit
Admission: RE | Admit: 2018-08-31 | Discharge: 2018-08-31 | Disposition: A | Discharge status: Home / Self Care | Payer: Medicare Other | Source: Ambulatory Visit | Attending: Obstetrics and Gynecology | Admitting: Obstetrics and Gynecology

## 2018-08-31 DIAGNOSIS — Z1231 Encounter for screening mammogram for malignant neoplasm of breast: Secondary | ICD-10-CM

## 2018-10-04 DIAGNOSIS — N952 Postmenopausal atrophic vaginitis: Secondary | ICD-10-CM | POA: Diagnosis not present

## 2018-10-04 DIAGNOSIS — N898 Other specified noninflammatory disorders of vagina: Secondary | ICD-10-CM | POA: Diagnosis not present

## 2018-10-12 ENCOUNTER — Other Ambulatory Visit: Payer: Self-pay | Admitting: Obstetrics and Gynecology

## 2018-10-12 DIAGNOSIS — M8580 Other specified disorders of bone density and structure, unspecified site: Secondary | ICD-10-CM

## 2018-10-18 DIAGNOSIS — M67823 Other specified disorders of tendon, right elbow: Secondary | ICD-10-CM | POA: Diagnosis not present

## 2018-10-18 DIAGNOSIS — M25511 Pain in right shoulder: Secondary | ICD-10-CM | POA: Diagnosis not present

## 2018-10-18 DIAGNOSIS — M19011 Primary osteoarthritis, right shoulder: Secondary | ICD-10-CM | POA: Diagnosis not present

## 2018-10-18 DIAGNOSIS — M25521 Pain in right elbow: Secondary | ICD-10-CM | POA: Diagnosis not present

## 2018-11-20 DIAGNOSIS — R0989 Other specified symptoms and signs involving the circulatory and respiratory systems: Secondary | ICD-10-CM | POA: Diagnosis not present

## 2018-11-23 DIAGNOSIS — M19011 Primary osteoarthritis, right shoulder: Secondary | ICD-10-CM | POA: Diagnosis not present

## 2018-11-28 DIAGNOSIS — M19011 Primary osteoarthritis, right shoulder: Secondary | ICD-10-CM | POA: Diagnosis not present

## 2018-11-28 DIAGNOSIS — M25511 Pain in right shoulder: Secondary | ICD-10-CM | POA: Diagnosis not present

## 2018-12-04 ENCOUNTER — Other Ambulatory Visit: Payer: Medicare Other

## 2019-01-29 ENCOUNTER — Other Ambulatory Visit: Payer: Medicare Other

## 2019-02-13 ENCOUNTER — Telehealth: Payer: Self-pay | Admitting: Orthopaedic Surgery

## 2019-02-13 NOTE — Telephone Encounter (Signed)
Patient had a hip replacement several years ago with Dr. Ninfa Linden. [place], and was asked for a card stating she had hip replacement. Patient would like a card. Patient states she has a friend that is coming in tomorrow with doctor, and she can get card for her. Please call to advise.

## 2019-02-13 NOTE — Telephone Encounter (Signed)
Patient aware I have card for her  her friend Clarise Cruz will pick up to me

## 2019-03-19 DIAGNOSIS — E559 Vitamin D deficiency, unspecified: Secondary | ICD-10-CM | POA: Diagnosis not present

## 2019-03-19 DIAGNOSIS — E039 Hypothyroidism, unspecified: Secondary | ICD-10-CM | POA: Diagnosis not present

## 2019-03-19 DIAGNOSIS — E875 Hyperkalemia: Secondary | ICD-10-CM | POA: Diagnosis not present

## 2019-03-26 DIAGNOSIS — E039 Hypothyroidism, unspecified: Secondary | ICD-10-CM | POA: Diagnosis not present

## 2019-03-26 DIAGNOSIS — K219 Gastro-esophageal reflux disease without esophagitis: Secondary | ICD-10-CM | POA: Diagnosis not present

## 2019-03-26 DIAGNOSIS — M8580 Other specified disorders of bone density and structure, unspecified site: Secondary | ICD-10-CM | POA: Diagnosis not present

## 2019-03-26 DIAGNOSIS — E559 Vitamin D deficiency, unspecified: Secondary | ICD-10-CM | POA: Diagnosis not present

## 2019-03-26 DIAGNOSIS — E7800 Pure hypercholesterolemia, unspecified: Secondary | ICD-10-CM | POA: Diagnosis not present

## 2019-03-26 DIAGNOSIS — K8020 Calculus of gallbladder without cholecystitis without obstruction: Secondary | ICD-10-CM | POA: Diagnosis not present

## 2019-03-26 DIAGNOSIS — Z803 Family history of malignant neoplasm of breast: Secondary | ICD-10-CM | POA: Diagnosis not present

## 2019-03-26 DIAGNOSIS — Z0000 Encounter for general adult medical examination without abnormal findings: Secondary | ICD-10-CM | POA: Diagnosis not present

## 2019-03-26 DIAGNOSIS — Z01818 Encounter for other preprocedural examination: Secondary | ICD-10-CM | POA: Diagnosis not present

## 2019-04-09 DIAGNOSIS — M25511 Pain in right shoulder: Secondary | ICD-10-CM | POA: Diagnosis not present

## 2019-04-09 DIAGNOSIS — M19011 Primary osteoarthritis, right shoulder: Secondary | ICD-10-CM | POA: Diagnosis not present

## 2019-04-11 ENCOUNTER — Other Ambulatory Visit: Payer: Self-pay

## 2019-04-11 ENCOUNTER — Ambulatory Visit
Admission: RE | Admit: 2019-04-11 | Discharge: 2019-04-11 | Disposition: A | Discharge status: Home / Self Care | Payer: Medicare Other | Source: Ambulatory Visit | Attending: Obstetrics and Gynecology | Admitting: Obstetrics and Gynecology

## 2019-04-11 DIAGNOSIS — Z780 Asymptomatic menopausal state: Secondary | ICD-10-CM | POA: Diagnosis not present

## 2019-04-11 DIAGNOSIS — M8589 Other specified disorders of bone density and structure, multiple sites: Secondary | ICD-10-CM | POA: Diagnosis not present

## 2019-04-11 DIAGNOSIS — M8580 Other specified disorders of bone density and structure, unspecified site: Secondary | ICD-10-CM

## 2019-05-10 DIAGNOSIS — E7800 Pure hypercholesterolemia, unspecified: Secondary | ICD-10-CM | POA: Diagnosis not present

## 2019-05-10 DIAGNOSIS — E039 Hypothyroidism, unspecified: Secondary | ICD-10-CM | POA: Diagnosis not present

## 2019-05-10 DIAGNOSIS — Z602 Problems related to living alone: Secondary | ICD-10-CM | POA: Diagnosis not present

## 2019-05-10 DIAGNOSIS — M19011 Primary osteoarthritis, right shoulder: Secondary | ICD-10-CM | POA: Diagnosis not present

## 2019-05-10 DIAGNOSIS — Z79899 Other long term (current) drug therapy: Secondary | ICD-10-CM | POA: Diagnosis not present

## 2019-05-10 DIAGNOSIS — K219 Gastro-esophageal reflux disease without esophagitis: Secondary | ICD-10-CM | POA: Diagnosis not present

## 2019-05-14 DIAGNOSIS — Z1159 Encounter for screening for other viral diseases: Secondary | ICD-10-CM | POA: Diagnosis not present

## 2019-05-17 DIAGNOSIS — Z602 Problems related to living alone: Secondary | ICD-10-CM | POA: Diagnosis present

## 2019-05-17 DIAGNOSIS — E7800 Pure hypercholesterolemia, unspecified: Secondary | ICD-10-CM | POA: Diagnosis present

## 2019-05-17 DIAGNOSIS — Z79899 Other long term (current) drug therapy: Secondary | ICD-10-CM | POA: Diagnosis not present

## 2019-05-17 DIAGNOSIS — Z471 Aftercare following joint replacement surgery: Secondary | ICD-10-CM | POA: Diagnosis not present

## 2019-05-17 DIAGNOSIS — Z96611 Presence of right artificial shoulder joint: Secondary | ICD-10-CM | POA: Diagnosis not present

## 2019-05-17 DIAGNOSIS — M25511 Pain in right shoulder: Secondary | ICD-10-CM | POA: Diagnosis not present

## 2019-05-17 DIAGNOSIS — E039 Hypothyroidism, unspecified: Secondary | ICD-10-CM | POA: Diagnosis present

## 2019-05-17 DIAGNOSIS — K219 Gastro-esophageal reflux disease without esophagitis: Secondary | ICD-10-CM | POA: Diagnosis present

## 2019-05-17 DIAGNOSIS — G8918 Other acute postprocedural pain: Secondary | ICD-10-CM | POA: Diagnosis not present

## 2019-05-17 DIAGNOSIS — M19011 Primary osteoarthritis, right shoulder: Secondary | ICD-10-CM | POA: Diagnosis not present

## 2019-05-17 HISTORY — PX: REVERSE TOTAL SHOULDER ARTHROPLASTY: SHX2344

## 2019-05-19 DIAGNOSIS — M6281 Muscle weakness (generalized): Secondary | ICD-10-CM | POA: Diagnosis not present

## 2019-05-19 DIAGNOSIS — Z96611 Presence of right artificial shoulder joint: Secondary | ICD-10-CM | POA: Diagnosis not present

## 2019-05-19 DIAGNOSIS — Z471 Aftercare following joint replacement surgery: Secondary | ICD-10-CM | POA: Diagnosis not present

## 2019-05-22 DIAGNOSIS — Z96611 Presence of right artificial shoulder joint: Secondary | ICD-10-CM | POA: Diagnosis not present

## 2019-05-22 DIAGNOSIS — Z471 Aftercare following joint replacement surgery: Secondary | ICD-10-CM | POA: Diagnosis not present

## 2019-05-22 DIAGNOSIS — M6281 Muscle weakness (generalized): Secondary | ICD-10-CM | POA: Diagnosis not present

## 2019-05-25 DIAGNOSIS — M6281 Muscle weakness (generalized): Secondary | ICD-10-CM | POA: Diagnosis not present

## 2019-05-25 DIAGNOSIS — Z96611 Presence of right artificial shoulder joint: Secondary | ICD-10-CM | POA: Diagnosis not present

## 2019-05-25 DIAGNOSIS — Z471 Aftercare following joint replacement surgery: Secondary | ICD-10-CM | POA: Diagnosis not present

## 2019-05-28 DIAGNOSIS — Z471 Aftercare following joint replacement surgery: Secondary | ICD-10-CM | POA: Diagnosis not present

## 2019-05-28 DIAGNOSIS — Z96611 Presence of right artificial shoulder joint: Secondary | ICD-10-CM | POA: Diagnosis not present

## 2019-05-28 DIAGNOSIS — M6281 Muscle weakness (generalized): Secondary | ICD-10-CM | POA: Diagnosis not present

## 2019-05-30 DIAGNOSIS — M25511 Pain in right shoulder: Secondary | ICD-10-CM | POA: Diagnosis not present

## 2019-05-31 DIAGNOSIS — Z96611 Presence of right artificial shoulder joint: Secondary | ICD-10-CM | POA: Diagnosis not present

## 2019-05-31 DIAGNOSIS — M6281 Muscle weakness (generalized): Secondary | ICD-10-CM | POA: Diagnosis not present

## 2019-05-31 DIAGNOSIS — Z471 Aftercare following joint replacement surgery: Secondary | ICD-10-CM | POA: Diagnosis not present

## 2019-06-04 DIAGNOSIS — Z471 Aftercare following joint replacement surgery: Secondary | ICD-10-CM | POA: Diagnosis not present

## 2019-06-04 DIAGNOSIS — M6281 Muscle weakness (generalized): Secondary | ICD-10-CM | POA: Diagnosis not present

## 2019-06-04 DIAGNOSIS — Z96611 Presence of right artificial shoulder joint: Secondary | ICD-10-CM | POA: Diagnosis not present

## 2019-06-07 DIAGNOSIS — M6281 Muscle weakness (generalized): Secondary | ICD-10-CM | POA: Diagnosis not present

## 2019-06-07 DIAGNOSIS — Z471 Aftercare following joint replacement surgery: Secondary | ICD-10-CM | POA: Diagnosis not present

## 2019-06-07 DIAGNOSIS — Z96611 Presence of right artificial shoulder joint: Secondary | ICD-10-CM | POA: Diagnosis not present

## 2019-06-08 DIAGNOSIS — E7800 Pure hypercholesterolemia, unspecified: Secondary | ICD-10-CM | POA: Diagnosis not present

## 2019-06-13 DIAGNOSIS — Z471 Aftercare following joint replacement surgery: Secondary | ICD-10-CM | POA: Diagnosis not present

## 2019-06-13 DIAGNOSIS — M6281 Muscle weakness (generalized): Secondary | ICD-10-CM | POA: Diagnosis not present

## 2019-06-13 DIAGNOSIS — Z96611 Presence of right artificial shoulder joint: Secondary | ICD-10-CM | POA: Diagnosis not present

## 2019-06-15 DIAGNOSIS — Z471 Aftercare following joint replacement surgery: Secondary | ICD-10-CM | POA: Diagnosis not present

## 2019-06-15 DIAGNOSIS — Z96611 Presence of right artificial shoulder joint: Secondary | ICD-10-CM | POA: Diagnosis not present

## 2019-06-15 DIAGNOSIS — M6281 Muscle weakness (generalized): Secondary | ICD-10-CM | POA: Diagnosis not present

## 2019-06-18 DIAGNOSIS — M6281 Muscle weakness (generalized): Secondary | ICD-10-CM | POA: Diagnosis not present

## 2019-06-18 DIAGNOSIS — Z96611 Presence of right artificial shoulder joint: Secondary | ICD-10-CM | POA: Diagnosis not present

## 2019-06-18 DIAGNOSIS — Z471 Aftercare following joint replacement surgery: Secondary | ICD-10-CM | POA: Diagnosis not present

## 2019-06-20 DIAGNOSIS — Z471 Aftercare following joint replacement surgery: Secondary | ICD-10-CM | POA: Diagnosis not present

## 2019-06-20 DIAGNOSIS — M6281 Muscle weakness (generalized): Secondary | ICD-10-CM | POA: Diagnosis not present

## 2019-06-20 DIAGNOSIS — Z96611 Presence of right artificial shoulder joint: Secondary | ICD-10-CM | POA: Diagnosis not present

## 2019-06-22 DIAGNOSIS — Z23 Encounter for immunization: Secondary | ICD-10-CM | POA: Diagnosis not present

## 2019-06-25 DIAGNOSIS — M6281 Muscle weakness (generalized): Secondary | ICD-10-CM | POA: Diagnosis not present

## 2019-06-25 DIAGNOSIS — Z96611 Presence of right artificial shoulder joint: Secondary | ICD-10-CM | POA: Diagnosis not present

## 2019-06-25 DIAGNOSIS — Z471 Aftercare following joint replacement surgery: Secondary | ICD-10-CM | POA: Diagnosis not present

## 2019-06-28 DIAGNOSIS — Z96611 Presence of right artificial shoulder joint: Secondary | ICD-10-CM | POA: Diagnosis not present

## 2019-06-28 DIAGNOSIS — Z471 Aftercare following joint replacement surgery: Secondary | ICD-10-CM | POA: Diagnosis not present

## 2019-06-28 DIAGNOSIS — M6281 Muscle weakness (generalized): Secondary | ICD-10-CM | POA: Diagnosis not present

## 2019-07-02 DIAGNOSIS — M6281 Muscle weakness (generalized): Secondary | ICD-10-CM | POA: Diagnosis not present

## 2019-07-02 DIAGNOSIS — Z471 Aftercare following joint replacement surgery: Secondary | ICD-10-CM | POA: Diagnosis not present

## 2019-07-02 DIAGNOSIS — Z96611 Presence of right artificial shoulder joint: Secondary | ICD-10-CM | POA: Diagnosis not present

## 2019-07-05 DIAGNOSIS — M6281 Muscle weakness (generalized): Secondary | ICD-10-CM | POA: Diagnosis not present

## 2019-07-05 DIAGNOSIS — Z96611 Presence of right artificial shoulder joint: Secondary | ICD-10-CM | POA: Diagnosis not present

## 2019-07-05 DIAGNOSIS — Z471 Aftercare following joint replacement surgery: Secondary | ICD-10-CM | POA: Diagnosis not present

## 2019-07-11 DIAGNOSIS — M6281 Muscle weakness (generalized): Secondary | ICD-10-CM | POA: Diagnosis not present

## 2019-07-11 DIAGNOSIS — Z471 Aftercare following joint replacement surgery: Secondary | ICD-10-CM | POA: Diagnosis not present

## 2019-07-11 DIAGNOSIS — Z96611 Presence of right artificial shoulder joint: Secondary | ICD-10-CM | POA: Diagnosis not present

## 2019-07-13 DIAGNOSIS — Z471 Aftercare following joint replacement surgery: Secondary | ICD-10-CM | POA: Diagnosis not present

## 2019-07-13 DIAGNOSIS — M6281 Muscle weakness (generalized): Secondary | ICD-10-CM | POA: Diagnosis not present

## 2019-07-13 DIAGNOSIS — Z96611 Presence of right artificial shoulder joint: Secondary | ICD-10-CM | POA: Diagnosis not present

## 2019-07-16 DIAGNOSIS — Z471 Aftercare following joint replacement surgery: Secondary | ICD-10-CM | POA: Diagnosis not present

## 2019-07-16 DIAGNOSIS — Z96611 Presence of right artificial shoulder joint: Secondary | ICD-10-CM | POA: Diagnosis not present

## 2019-07-16 DIAGNOSIS — M6281 Muscle weakness (generalized): Secondary | ICD-10-CM | POA: Diagnosis not present

## 2019-07-18 ENCOUNTER — Other Ambulatory Visit: Payer: Self-pay | Admitting: Obstetrics and Gynecology

## 2019-07-18 DIAGNOSIS — Z1231 Encounter for screening mammogram for malignant neoplasm of breast: Secondary | ICD-10-CM

## 2019-07-18 DIAGNOSIS — M545 Low back pain: Secondary | ICD-10-CM | POA: Diagnosis not present

## 2019-07-18 DIAGNOSIS — Z471 Aftercare following joint replacement surgery: Secondary | ICD-10-CM | POA: Diagnosis not present

## 2019-07-18 DIAGNOSIS — M6281 Muscle weakness (generalized): Secondary | ICD-10-CM | POA: Diagnosis not present

## 2019-07-18 DIAGNOSIS — Z96611 Presence of right artificial shoulder joint: Secondary | ICD-10-CM | POA: Diagnosis not present

## 2019-07-19 DIAGNOSIS — Z471 Aftercare following joint replacement surgery: Secondary | ICD-10-CM | POA: Diagnosis not present

## 2019-07-19 DIAGNOSIS — Z96611 Presence of right artificial shoulder joint: Secondary | ICD-10-CM | POA: Diagnosis not present

## 2019-07-19 DIAGNOSIS — M6281 Muscle weakness (generalized): Secondary | ICD-10-CM | POA: Diagnosis not present

## 2019-07-23 DIAGNOSIS — M6281 Muscle weakness (generalized): Secondary | ICD-10-CM | POA: Diagnosis not present

## 2019-07-23 DIAGNOSIS — Z96611 Presence of right artificial shoulder joint: Secondary | ICD-10-CM | POA: Diagnosis not present

## 2019-07-23 DIAGNOSIS — Z471 Aftercare following joint replacement surgery: Secondary | ICD-10-CM | POA: Diagnosis not present

## 2019-07-26 DIAGNOSIS — Z471 Aftercare following joint replacement surgery: Secondary | ICD-10-CM | POA: Diagnosis not present

## 2019-07-26 DIAGNOSIS — M6281 Muscle weakness (generalized): Secondary | ICD-10-CM | POA: Diagnosis not present

## 2019-07-26 DIAGNOSIS — Z96611 Presence of right artificial shoulder joint: Secondary | ICD-10-CM | POA: Diagnosis not present

## 2019-07-30 DIAGNOSIS — M6281 Muscle weakness (generalized): Secondary | ICD-10-CM | POA: Diagnosis not present

## 2019-07-30 DIAGNOSIS — Z96611 Presence of right artificial shoulder joint: Secondary | ICD-10-CM | POA: Diagnosis not present

## 2019-07-30 DIAGNOSIS — Z471 Aftercare following joint replacement surgery: Secondary | ICD-10-CM | POA: Diagnosis not present

## 2019-08-02 DIAGNOSIS — Z96611 Presence of right artificial shoulder joint: Secondary | ICD-10-CM | POA: Diagnosis not present

## 2019-08-02 DIAGNOSIS — Z471 Aftercare following joint replacement surgery: Secondary | ICD-10-CM | POA: Diagnosis not present

## 2019-08-02 DIAGNOSIS — M6281 Muscle weakness (generalized): Secondary | ICD-10-CM | POA: Diagnosis not present

## 2019-08-06 DIAGNOSIS — M6281 Muscle weakness (generalized): Secondary | ICD-10-CM | POA: Diagnosis not present

## 2019-08-06 DIAGNOSIS — Z96611 Presence of right artificial shoulder joint: Secondary | ICD-10-CM | POA: Diagnosis not present

## 2019-08-06 DIAGNOSIS — Z471 Aftercare following joint replacement surgery: Secondary | ICD-10-CM | POA: Diagnosis not present

## 2019-08-13 DIAGNOSIS — Z96611 Presence of right artificial shoulder joint: Secondary | ICD-10-CM | POA: Diagnosis not present

## 2019-08-13 DIAGNOSIS — Z471 Aftercare following joint replacement surgery: Secondary | ICD-10-CM | POA: Diagnosis not present

## 2019-08-13 DIAGNOSIS — M6281 Muscle weakness (generalized): Secondary | ICD-10-CM | POA: Diagnosis not present

## 2019-08-13 DIAGNOSIS — E7800 Pure hypercholesterolemia, unspecified: Secondary | ICD-10-CM | POA: Diagnosis not present

## 2019-08-15 DIAGNOSIS — M545 Low back pain: Secondary | ICD-10-CM | POA: Diagnosis not present

## 2019-08-15 DIAGNOSIS — M25511 Pain in right shoulder: Secondary | ICD-10-CM | POA: Diagnosis not present

## 2019-08-17 DIAGNOSIS — M545 Low back pain: Secondary | ICD-10-CM | POA: Diagnosis not present

## 2019-08-17 DIAGNOSIS — M25511 Pain in right shoulder: Secondary | ICD-10-CM | POA: Diagnosis not present

## 2019-08-20 DIAGNOSIS — M25511 Pain in right shoulder: Secondary | ICD-10-CM | POA: Diagnosis not present

## 2019-08-20 DIAGNOSIS — M545 Low back pain: Secondary | ICD-10-CM | POA: Diagnosis not present

## 2019-08-22 DIAGNOSIS — M545 Low back pain: Secondary | ICD-10-CM | POA: Diagnosis not present

## 2019-08-22 DIAGNOSIS — M25511 Pain in right shoulder: Secondary | ICD-10-CM | POA: Diagnosis not present

## 2019-08-24 DIAGNOSIS — M545 Low back pain: Secondary | ICD-10-CM | POA: Diagnosis not present

## 2019-08-24 DIAGNOSIS — M25511 Pain in right shoulder: Secondary | ICD-10-CM | POA: Diagnosis not present

## 2019-09-11 ENCOUNTER — Other Ambulatory Visit: Payer: Self-pay

## 2019-09-11 ENCOUNTER — Ambulatory Visit
Admission: RE | Admit: 2019-09-11 | Discharge: 2019-09-11 | Disposition: A | Discharge status: Home / Self Care | Payer: Medicare Other | Source: Ambulatory Visit | Attending: Obstetrics and Gynecology | Admitting: Obstetrics and Gynecology

## 2019-09-11 DIAGNOSIS — Z1231 Encounter for screening mammogram for malignant neoplasm of breast: Secondary | ICD-10-CM

## 2019-09-17 DIAGNOSIS — M545 Low back pain: Secondary | ICD-10-CM | POA: Diagnosis not present

## 2019-09-17 DIAGNOSIS — M25511 Pain in right shoulder: Secondary | ICD-10-CM | POA: Diagnosis not present

## 2019-09-19 DIAGNOSIS — M545 Low back pain: Secondary | ICD-10-CM | POA: Diagnosis not present

## 2019-09-19 DIAGNOSIS — M25511 Pain in right shoulder: Secondary | ICD-10-CM | POA: Diagnosis not present

## 2019-09-21 DIAGNOSIS — M25511 Pain in right shoulder: Secondary | ICD-10-CM | POA: Diagnosis not present

## 2019-09-21 DIAGNOSIS — M545 Low back pain: Secondary | ICD-10-CM | POA: Diagnosis not present

## 2019-10-01 DIAGNOSIS — M25511 Pain in right shoulder: Secondary | ICD-10-CM | POA: Diagnosis not present

## 2019-10-01 DIAGNOSIS — M545 Low back pain: Secondary | ICD-10-CM | POA: Diagnosis not present

## 2019-10-03 DIAGNOSIS — M25511 Pain in right shoulder: Secondary | ICD-10-CM | POA: Diagnosis not present

## 2019-10-03 DIAGNOSIS — M545 Low back pain: Secondary | ICD-10-CM | POA: Diagnosis not present

## 2019-10-08 DIAGNOSIS — M545 Low back pain: Secondary | ICD-10-CM | POA: Diagnosis not present

## 2019-10-08 DIAGNOSIS — M25511 Pain in right shoulder: Secondary | ICD-10-CM | POA: Diagnosis not present

## 2019-10-10 DIAGNOSIS — M545 Low back pain: Secondary | ICD-10-CM | POA: Diagnosis not present

## 2019-10-10 DIAGNOSIS — M25511 Pain in right shoulder: Secondary | ICD-10-CM | POA: Diagnosis not present

## 2019-10-15 DIAGNOSIS — M545 Low back pain: Secondary | ICD-10-CM | POA: Diagnosis not present

## 2019-10-15 DIAGNOSIS — M25511 Pain in right shoulder: Secondary | ICD-10-CM | POA: Diagnosis not present

## 2019-10-17 DIAGNOSIS — M545 Low back pain: Secondary | ICD-10-CM | POA: Diagnosis not present

## 2019-10-17 DIAGNOSIS — M25511 Pain in right shoulder: Secondary | ICD-10-CM | POA: Diagnosis not present

## 2019-10-22 DIAGNOSIS — M25511 Pain in right shoulder: Secondary | ICD-10-CM | POA: Diagnosis not present

## 2019-10-22 DIAGNOSIS — M545 Low back pain: Secondary | ICD-10-CM | POA: Diagnosis not present

## 2019-10-24 ENCOUNTER — Ambulatory Visit: Payer: Medicare Other

## 2019-10-24 DIAGNOSIS — M545 Low back pain: Secondary | ICD-10-CM | POA: Diagnosis not present

## 2019-10-24 DIAGNOSIS — M25511 Pain in right shoulder: Secondary | ICD-10-CM | POA: Diagnosis not present

## 2019-10-29 DIAGNOSIS — M545 Low back pain: Secondary | ICD-10-CM | POA: Diagnosis not present

## 2019-10-29 DIAGNOSIS — M25511 Pain in right shoulder: Secondary | ICD-10-CM | POA: Diagnosis not present

## 2019-10-31 DIAGNOSIS — M25511 Pain in right shoulder: Secondary | ICD-10-CM | POA: Diagnosis not present

## 2019-10-31 DIAGNOSIS — M545 Low back pain: Secondary | ICD-10-CM | POA: Diagnosis not present

## 2019-11-05 DIAGNOSIS — M545 Low back pain: Secondary | ICD-10-CM | POA: Diagnosis not present

## 2019-11-05 DIAGNOSIS — M25511 Pain in right shoulder: Secondary | ICD-10-CM | POA: Diagnosis not present

## 2019-11-07 DIAGNOSIS — M25511 Pain in right shoulder: Secondary | ICD-10-CM | POA: Diagnosis not present

## 2019-11-07 DIAGNOSIS — M545 Low back pain: Secondary | ICD-10-CM | POA: Diagnosis not present

## 2019-11-14 DIAGNOSIS — N898 Other specified noninflammatory disorders of vagina: Secondary | ICD-10-CM | POA: Diagnosis not present

## 2019-11-14 DIAGNOSIS — N950 Postmenopausal bleeding: Secondary | ICD-10-CM | POA: Diagnosis not present

## 2019-11-28 DIAGNOSIS — N950 Postmenopausal bleeding: Secondary | ICD-10-CM | POA: Diagnosis not present

## 2019-11-28 DIAGNOSIS — N898 Other specified noninflammatory disorders of vagina: Secondary | ICD-10-CM | POA: Diagnosis not present

## 2019-11-28 DIAGNOSIS — N952 Postmenopausal atrophic vaginitis: Secondary | ICD-10-CM | POA: Diagnosis not present

## 2019-12-06 DIAGNOSIS — H5213 Myopia, bilateral: Secondary | ICD-10-CM | POA: Diagnosis not present

## 2019-12-06 DIAGNOSIS — H43811 Vitreous degeneration, right eye: Secondary | ICD-10-CM | POA: Diagnosis not present

## 2019-12-06 DIAGNOSIS — H524 Presbyopia: Secondary | ICD-10-CM | POA: Diagnosis not present

## 2020-01-23 DIAGNOSIS — N839 Noninflammatory disorder of ovary, fallopian tube and broad ligament, unspecified: Secondary | ICD-10-CM | POA: Diagnosis not present

## 2020-01-23 DIAGNOSIS — N898 Other specified noninflammatory disorders of vagina: Secondary | ICD-10-CM | POA: Diagnosis not present

## 2020-01-23 DIAGNOSIS — R319 Hematuria, unspecified: Secondary | ICD-10-CM | POA: Diagnosis not present

## 2020-01-23 DIAGNOSIS — N939 Abnormal uterine and vaginal bleeding, unspecified: Secondary | ICD-10-CM | POA: Diagnosis not present

## 2020-01-23 DIAGNOSIS — N950 Postmenopausal bleeding: Secondary | ICD-10-CM | POA: Diagnosis not present

## 2020-02-22 DIAGNOSIS — N390 Urinary tract infection, site not specified: Secondary | ICD-10-CM | POA: Diagnosis not present

## 2020-02-22 DIAGNOSIS — N8111 Cystocele, midline: Secondary | ICD-10-CM | POA: Diagnosis not present

## 2020-02-27 ENCOUNTER — Other Ambulatory Visit (HOSPITAL_COMMUNITY)
Admission: RE | Admit: 2020-02-27 | Discharge: 2020-02-27 | Disposition: A | Discharge status: Home / Self Care | Payer: Medicare Other | Source: Other Acute Inpatient Hospital | Attending: Dermatology | Admitting: Dermatology

## 2020-02-27 DIAGNOSIS — G453 Amaurosis fugax: Secondary | ICD-10-CM | POA: Diagnosis not present

## 2020-02-27 DIAGNOSIS — H5310 Unspecified subjective visual disturbances: Secondary | ICD-10-CM | POA: Diagnosis not present

## 2020-02-27 LAB — C-REACTIVE PROTEIN: CRP: 3.3 mg/dL — ABNORMAL HIGH (ref <1.0)

## 2020-02-27 LAB — CBC
HCT: 40.6 % (ref 36.0–46.0)
Hemoglobin: 13.4 g/dL (ref 12.0–15.0)
MCH: 31.2 pg (ref 26.0–34.0)
MCHC: 33 g/dL (ref 30.0–36.0)
MCV: 94.4 fL (ref 80.0–100.0)
Platelets: 249 10*3/uL (ref 150–400)
RBC: 4.3 MIL/uL (ref 3.87–5.11)
RDW: 13.1 % (ref 11.5–15.5)
WBC: 5.2 10*3/uL (ref 4.0–10.5)
nRBC: 0 % (ref 0.0–0.2)

## 2020-02-27 LAB — SEDIMENTATION RATE: Sed Rate: 34 mm/hr — ABNORMAL HIGH (ref 0–22)

## 2020-02-28 ENCOUNTER — Other Ambulatory Visit (HOSPITAL_COMMUNITY): Payer: Self-pay | Admitting: Optometry

## 2020-02-28 ENCOUNTER — Other Ambulatory Visit: Payer: Self-pay

## 2020-02-28 ENCOUNTER — Ambulatory Visit (HOSPITAL_COMMUNITY)
Admission: RE | Admit: 2020-02-28 | Discharge: 2020-02-28 | Disposition: A | Discharge status: Home / Self Care | Payer: Medicare Other | Source: Ambulatory Visit | Attending: Cardiology | Admitting: Cardiology

## 2020-02-28 DIAGNOSIS — G453 Amaurosis fugax: Secondary | ICD-10-CM | POA: Diagnosis not present

## 2020-03-10 DIAGNOSIS — N8111 Cystocele, midline: Secondary | ICD-10-CM | POA: Diagnosis not present

## 2020-03-24 DIAGNOSIS — E039 Hypothyroidism, unspecified: Secondary | ICD-10-CM | POA: Diagnosis not present

## 2020-03-24 DIAGNOSIS — E7800 Pure hypercholesterolemia, unspecified: Secondary | ICD-10-CM | POA: Diagnosis not present

## 2020-03-31 DIAGNOSIS — Z0000 Encounter for general adult medical examination without abnormal findings: Secondary | ICD-10-CM | POA: Diagnosis not present

## 2020-03-31 DIAGNOSIS — M316 Other giant cell arteritis: Secondary | ICD-10-CM | POA: Diagnosis not present

## 2020-03-31 DIAGNOSIS — M8580 Other specified disorders of bone density and structure, unspecified site: Secondary | ICD-10-CM | POA: Diagnosis not present

## 2020-03-31 DIAGNOSIS — G453 Amaurosis fugax: Secondary | ICD-10-CM | POA: Diagnosis not present

## 2020-03-31 DIAGNOSIS — Z792 Long term (current) use of antibiotics: Secondary | ICD-10-CM | POA: Diagnosis not present

## 2020-03-31 DIAGNOSIS — K219 Gastro-esophageal reflux disease without esophagitis: Secondary | ICD-10-CM | POA: Diagnosis not present

## 2020-03-31 DIAGNOSIS — E7800 Pure hypercholesterolemia, unspecified: Secondary | ICD-10-CM | POA: Diagnosis not present

## 2020-03-31 DIAGNOSIS — K8020 Calculus of gallbladder without cholecystitis without obstruction: Secondary | ICD-10-CM | POA: Diagnosis not present

## 2020-03-31 DIAGNOSIS — I6523 Occlusion and stenosis of bilateral carotid arteries: Secondary | ICD-10-CM | POA: Diagnosis not present

## 2020-03-31 DIAGNOSIS — E039 Hypothyroidism, unspecified: Secondary | ICD-10-CM | POA: Diagnosis not present

## 2020-03-31 DIAGNOSIS — E559 Vitamin D deficiency, unspecified: Secondary | ICD-10-CM | POA: Diagnosis not present

## 2020-03-31 DIAGNOSIS — N952 Postmenopausal atrophic vaginitis: Secondary | ICD-10-CM | POA: Diagnosis not present

## 2020-04-16 DIAGNOSIS — N8111 Cystocele, midline: Secondary | ICD-10-CM | POA: Diagnosis not present

## 2020-04-24 DIAGNOSIS — R519 Headache, unspecified: Secondary | ICD-10-CM | POA: Diagnosis not present

## 2020-04-24 DIAGNOSIS — H538 Other visual disturbances: Secondary | ICD-10-CM | POA: Diagnosis not present

## 2020-04-24 DIAGNOSIS — G8929 Other chronic pain: Secondary | ICD-10-CM | POA: Diagnosis not present

## 2020-05-09 DIAGNOSIS — Z23 Encounter for immunization: Secondary | ICD-10-CM | POA: Diagnosis not present

## 2020-05-21 DIAGNOSIS — M25512 Pain in left shoulder: Secondary | ICD-10-CM | POA: Diagnosis not present

## 2020-05-21 DIAGNOSIS — M19011 Primary osteoarthritis, right shoulder: Secondary | ICD-10-CM | POA: Diagnosis not present

## 2020-05-21 DIAGNOSIS — M25511 Pain in right shoulder: Secondary | ICD-10-CM | POA: Diagnosis not present

## 2020-05-23 DIAGNOSIS — R9082 White matter disease, unspecified: Secondary | ICD-10-CM | POA: Diagnosis not present

## 2020-05-28 DIAGNOSIS — N8111 Cystocele, midline: Secondary | ICD-10-CM | POA: Diagnosis not present

## 2020-06-13 DIAGNOSIS — Z23 Encounter for immunization: Secondary | ICD-10-CM | POA: Diagnosis not present

## 2020-07-29 ENCOUNTER — Other Ambulatory Visit: Payer: Self-pay | Admitting: Obstetrics and Gynecology

## 2020-07-29 DIAGNOSIS — Z1231 Encounter for screening mammogram for malignant neoplasm of breast: Secondary | ICD-10-CM

## 2020-09-11 ENCOUNTER — Ambulatory Visit: Payer: Medicare Other

## 2020-10-02 DIAGNOSIS — Z01812 Encounter for preprocedural laboratory examination: Secondary | ICD-10-CM | POA: Diagnosis not present

## 2020-10-07 DIAGNOSIS — Z1211 Encounter for screening for malignant neoplasm of colon: Secondary | ICD-10-CM | POA: Diagnosis not present

## 2020-10-09 ENCOUNTER — Other Ambulatory Visit: Payer: Self-pay

## 2020-10-09 ENCOUNTER — Ambulatory Visit
Admission: RE | Admit: 2020-10-09 | Discharge: 2020-10-09 | Disposition: A | Discharge status: Home / Self Care | Payer: Medicare Other | Source: Ambulatory Visit | Attending: Obstetrics and Gynecology | Admitting: Obstetrics and Gynecology

## 2020-10-09 DIAGNOSIS — Z1231 Encounter for screening mammogram for malignant neoplasm of breast: Secondary | ICD-10-CM | POA: Diagnosis not present

## 2020-12-15 DIAGNOSIS — R151 Fecal smearing: Secondary | ICD-10-CM | POA: Diagnosis not present

## 2020-12-15 DIAGNOSIS — K219 Gastro-esophageal reflux disease without esophagitis: Secondary | ICD-10-CM | POA: Diagnosis not present

## 2020-12-15 DIAGNOSIS — Z23 Encounter for immunization: Secondary | ICD-10-CM | POA: Diagnosis not present

## 2020-12-23 DIAGNOSIS — M6289 Other specified disorders of muscle: Secondary | ICD-10-CM | POA: Diagnosis not present

## 2020-12-23 DIAGNOSIS — R350 Frequency of micturition: Secondary | ICD-10-CM | POA: Diagnosis not present

## 2020-12-23 DIAGNOSIS — M6281 Muscle weakness (generalized): Secondary | ICD-10-CM | POA: Diagnosis not present

## 2020-12-23 DIAGNOSIS — R151 Fecal smearing: Secondary | ICD-10-CM | POA: Diagnosis not present

## 2021-01-01 DIAGNOSIS — M62838 Other muscle spasm: Secondary | ICD-10-CM | POA: Diagnosis not present

## 2021-01-01 DIAGNOSIS — R351 Nocturia: Secondary | ICD-10-CM | POA: Diagnosis not present

## 2021-01-01 DIAGNOSIS — M6281 Muscle weakness (generalized): Secondary | ICD-10-CM | POA: Diagnosis not present

## 2021-01-01 DIAGNOSIS — M6289 Other specified disorders of muscle: Secondary | ICD-10-CM | POA: Diagnosis not present

## 2021-01-01 DIAGNOSIS — R151 Fecal smearing: Secondary | ICD-10-CM | POA: Diagnosis not present

## 2021-01-06 DIAGNOSIS — M170 Bilateral primary osteoarthritis of knee: Secondary | ICD-10-CM | POA: Diagnosis not present

## 2021-01-08 DIAGNOSIS — M6289 Other specified disorders of muscle: Secondary | ICD-10-CM | POA: Diagnosis not present

## 2021-01-08 DIAGNOSIS — R351 Nocturia: Secondary | ICD-10-CM | POA: Diagnosis not present

## 2021-01-08 DIAGNOSIS — R151 Fecal smearing: Secondary | ICD-10-CM | POA: Diagnosis not present

## 2021-01-08 DIAGNOSIS — M62838 Other muscle spasm: Secondary | ICD-10-CM | POA: Diagnosis not present

## 2021-01-08 DIAGNOSIS — M6281 Muscle weakness (generalized): Secondary | ICD-10-CM | POA: Diagnosis not present

## 2021-01-08 DIAGNOSIS — R350 Frequency of micturition: Secondary | ICD-10-CM | POA: Diagnosis not present

## 2021-01-12 DIAGNOSIS — Z20822 Contact with and (suspected) exposure to covid-19: Secondary | ICD-10-CM | POA: Diagnosis not present

## 2021-01-14 DIAGNOSIS — M6289 Other specified disorders of muscle: Secondary | ICD-10-CM | POA: Diagnosis not present

## 2021-01-14 DIAGNOSIS — R151 Fecal smearing: Secondary | ICD-10-CM | POA: Diagnosis not present

## 2021-01-14 DIAGNOSIS — M62838 Other muscle spasm: Secondary | ICD-10-CM | POA: Diagnosis not present

## 2021-01-14 DIAGNOSIS — M6281 Muscle weakness (generalized): Secondary | ICD-10-CM | POA: Diagnosis not present

## 2021-01-14 DIAGNOSIS — R351 Nocturia: Secondary | ICD-10-CM | POA: Diagnosis not present

## 2021-01-20 DIAGNOSIS — M25561 Pain in right knee: Secondary | ICD-10-CM | POA: Diagnosis not present

## 2021-01-20 DIAGNOSIS — M25562 Pain in left knee: Secondary | ICD-10-CM | POA: Diagnosis not present

## 2021-01-22 DIAGNOSIS — R151 Fecal smearing: Secondary | ICD-10-CM | POA: Diagnosis not present

## 2021-01-22 DIAGNOSIS — M62838 Other muscle spasm: Secondary | ICD-10-CM | POA: Diagnosis not present

## 2021-01-22 DIAGNOSIS — M6281 Muscle weakness (generalized): Secondary | ICD-10-CM | POA: Diagnosis not present

## 2021-01-22 DIAGNOSIS — M6289 Other specified disorders of muscle: Secondary | ICD-10-CM | POA: Diagnosis not present

## 2021-01-27 DIAGNOSIS — R351 Nocturia: Secondary | ICD-10-CM | POA: Diagnosis not present

## 2021-01-27 DIAGNOSIS — M6281 Muscle weakness (generalized): Secondary | ICD-10-CM | POA: Diagnosis not present

## 2021-01-27 DIAGNOSIS — R151 Fecal smearing: Secondary | ICD-10-CM | POA: Diagnosis not present

## 2021-01-27 DIAGNOSIS — M62838 Other muscle spasm: Secondary | ICD-10-CM | POA: Diagnosis not present

## 2021-01-27 DIAGNOSIS — M6289 Other specified disorders of muscle: Secondary | ICD-10-CM | POA: Diagnosis not present

## 2021-01-28 DIAGNOSIS — M25561 Pain in right knee: Secondary | ICD-10-CM | POA: Diagnosis not present

## 2021-01-28 DIAGNOSIS — M25562 Pain in left knee: Secondary | ICD-10-CM | POA: Diagnosis not present

## 2021-02-11 DIAGNOSIS — M25562 Pain in left knee: Secondary | ICD-10-CM | POA: Diagnosis not present

## 2021-02-11 DIAGNOSIS — M25561 Pain in right knee: Secondary | ICD-10-CM | POA: Diagnosis not present

## 2021-02-19 DIAGNOSIS — R151 Fecal smearing: Secondary | ICD-10-CM | POA: Diagnosis not present

## 2021-02-19 DIAGNOSIS — M6281 Muscle weakness (generalized): Secondary | ICD-10-CM | POA: Diagnosis not present

## 2021-02-19 DIAGNOSIS — M6289 Other specified disorders of muscle: Secondary | ICD-10-CM | POA: Diagnosis not present

## 2021-02-19 DIAGNOSIS — R351 Nocturia: Secondary | ICD-10-CM | POA: Diagnosis not present

## 2021-02-19 DIAGNOSIS — M62838 Other muscle spasm: Secondary | ICD-10-CM | POA: Diagnosis not present

## 2021-04-02 DIAGNOSIS — E7800 Pure hypercholesterolemia, unspecified: Secondary | ICD-10-CM | POA: Diagnosis not present

## 2021-04-02 DIAGNOSIS — E039 Hypothyroidism, unspecified: Secondary | ICD-10-CM | POA: Diagnosis not present

## 2021-04-09 DIAGNOSIS — M25562 Pain in left knee: Secondary | ICD-10-CM | POA: Diagnosis not present

## 2021-04-09 DIAGNOSIS — G8929 Other chronic pain: Secondary | ICD-10-CM | POA: Diagnosis not present

## 2021-04-09 DIAGNOSIS — M25561 Pain in right knee: Secondary | ICD-10-CM | POA: Diagnosis not present

## 2021-04-09 DIAGNOSIS — Z0000 Encounter for general adult medical examination without abnormal findings: Secondary | ICD-10-CM | POA: Diagnosis not present

## 2021-04-09 DIAGNOSIS — E039 Hypothyroidism, unspecified: Secondary | ICD-10-CM | POA: Diagnosis not present

## 2021-04-09 DIAGNOSIS — E7800 Pure hypercholesterolemia, unspecified: Secondary | ICD-10-CM | POA: Diagnosis not present

## 2021-04-09 DIAGNOSIS — K219 Gastro-esophageal reflux disease without esophagitis: Secondary | ICD-10-CM | POA: Diagnosis not present

## 2021-04-09 DIAGNOSIS — M545 Low back pain, unspecified: Secondary | ICD-10-CM | POA: Diagnosis not present

## 2021-04-21 ENCOUNTER — Other Ambulatory Visit: Payer: Self-pay | Admitting: Internal Medicine

## 2021-04-21 DIAGNOSIS — R5381 Other malaise: Secondary | ICD-10-CM

## 2021-04-22 ENCOUNTER — Other Ambulatory Visit: Payer: Self-pay | Admitting: Internal Medicine

## 2021-04-22 DIAGNOSIS — M8580 Other specified disorders of bone density and structure, unspecified site: Secondary | ICD-10-CM

## 2021-04-23 ENCOUNTER — Ambulatory Visit: Payer: Medicare Other | Admitting: Orthopaedic Surgery

## 2021-04-28 ENCOUNTER — Other Ambulatory Visit: Payer: Self-pay | Admitting: Obstetrics and Gynecology

## 2021-04-28 DIAGNOSIS — Z1231 Encounter for screening mammogram for malignant neoplasm of breast: Secondary | ICD-10-CM

## 2021-05-10 DIAGNOSIS — U071 COVID-19: Secondary | ICD-10-CM | POA: Diagnosis not present

## 2021-05-19 DIAGNOSIS — Z90710 Acquired absence of both cervix and uterus: Secondary | ICD-10-CM | POA: Diagnosis not present

## 2021-05-19 DIAGNOSIS — Z01419 Encounter for gynecological examination (general) (routine) without abnormal findings: Secondary | ICD-10-CM | POA: Diagnosis not present

## 2021-05-19 DIAGNOSIS — N952 Postmenopausal atrophic vaginitis: Secondary | ICD-10-CM | POA: Diagnosis not present

## 2021-05-20 DIAGNOSIS — Z23 Encounter for immunization: Secondary | ICD-10-CM | POA: Diagnosis not present

## 2021-06-05 DIAGNOSIS — Z23 Encounter for immunization: Secondary | ICD-10-CM | POA: Diagnosis not present

## 2021-06-11 DIAGNOSIS — H524 Presbyopia: Secondary | ICD-10-CM | POA: Diagnosis not present

## 2021-06-11 DIAGNOSIS — H10413 Chronic giant papillary conjunctivitis, bilateral: Secondary | ICD-10-CM | POA: Diagnosis not present

## 2021-07-29 DIAGNOSIS — U071 COVID-19: Secondary | ICD-10-CM | POA: Diagnosis not present

## 2021-08-29 DIAGNOSIS — U071 COVID-19: Secondary | ICD-10-CM | POA: Diagnosis not present

## 2021-10-20 ENCOUNTER — Ambulatory Visit
Admission: RE | Admit: 2021-10-20 | Discharge: 2021-10-20 | Disposition: A | Discharge status: Home / Self Care | Payer: Medicare Other | Source: Ambulatory Visit | Attending: Obstetrics and Gynecology | Admitting: Obstetrics and Gynecology

## 2021-10-20 ENCOUNTER — Ambulatory Visit
Admission: RE | Admit: 2021-10-20 | Discharge: 2021-10-20 | Disposition: A | Discharge status: Home / Self Care | Payer: Medicare Other | Source: Ambulatory Visit | Attending: Internal Medicine | Admitting: Internal Medicine

## 2021-10-20 DIAGNOSIS — Z1231 Encounter for screening mammogram for malignant neoplasm of breast: Secondary | ICD-10-CM

## 2021-10-20 DIAGNOSIS — M8580 Other specified disorders of bone density and structure, unspecified site: Secondary | ICD-10-CM

## 2021-10-20 DIAGNOSIS — M8589 Other specified disorders of bone density and structure, multiple sites: Secondary | ICD-10-CM | POA: Diagnosis not present

## 2021-10-20 DIAGNOSIS — Z780 Asymptomatic menopausal state: Secondary | ICD-10-CM | POA: Diagnosis not present

## 2021-10-26 ENCOUNTER — Other Ambulatory Visit: Payer: Self-pay | Admitting: Internal Medicine

## 2021-10-26 DIAGNOSIS — N63 Unspecified lump in unspecified breast: Secondary | ICD-10-CM

## 2021-11-03 DIAGNOSIS — R928 Other abnormal and inconclusive findings on diagnostic imaging of breast: Secondary | ICD-10-CM | POA: Diagnosis not present

## 2021-11-03 DIAGNOSIS — N6489 Other specified disorders of breast: Secondary | ICD-10-CM | POA: Diagnosis not present

## 2021-11-06 DIAGNOSIS — N641 Fat necrosis of breast: Secondary | ICD-10-CM | POA: Diagnosis not present

## 2021-11-06 DIAGNOSIS — N6311 Unspecified lump in the right breast, upper outer quadrant: Secondary | ICD-10-CM | POA: Diagnosis not present

## 2021-11-06 DIAGNOSIS — N6031 Fibrosclerosis of right breast: Secondary | ICD-10-CM | POA: Diagnosis not present

## 2021-11-08 DIAGNOSIS — U071 COVID-19: Secondary | ICD-10-CM | POA: Diagnosis not present

## 2021-12-23 DIAGNOSIS — U071 COVID-19: Secondary | ICD-10-CM | POA: Diagnosis not present

## 2022-01-20 DIAGNOSIS — U071 COVID-19: Secondary | ICD-10-CM | POA: Diagnosis not present

## 2022-01-21 DIAGNOSIS — Z23 Encounter for immunization: Secondary | ICD-10-CM | POA: Diagnosis not present

## 2022-01-21 DIAGNOSIS — U071 COVID-19: Secondary | ICD-10-CM | POA: Diagnosis not present

## 2022-04-13 DIAGNOSIS — E039 Hypothyroidism, unspecified: Secondary | ICD-10-CM | POA: Diagnosis not present

## 2022-04-13 DIAGNOSIS — E7800 Pure hypercholesterolemia, unspecified: Secondary | ICD-10-CM | POA: Diagnosis not present

## 2022-04-13 DIAGNOSIS — G453 Amaurosis fugax: Secondary | ICD-10-CM | POA: Diagnosis not present

## 2022-04-28 ENCOUNTER — Other Ambulatory Visit: Payer: Self-pay | Admitting: Internal Medicine

## 2022-04-28 DIAGNOSIS — Z0000 Encounter for general adult medical examination without abnormal findings: Secondary | ICD-10-CM | POA: Diagnosis not present

## 2022-04-28 DIAGNOSIS — M8580 Other specified disorders of bone density and structure, unspecified site: Secondary | ICD-10-CM | POA: Diagnosis not present

## 2022-04-28 DIAGNOSIS — E039 Hypothyroidism, unspecified: Secondary | ICD-10-CM | POA: Diagnosis not present

## 2022-04-28 DIAGNOSIS — E7800 Pure hypercholesterolemia, unspecified: Secondary | ICD-10-CM

## 2022-04-28 DIAGNOSIS — K219 Gastro-esophageal reflux disease without esophagitis: Secondary | ICD-10-CM | POA: Diagnosis not present

## 2022-04-28 DIAGNOSIS — I6523 Occlusion and stenosis of bilateral carotid arteries: Secondary | ICD-10-CM | POA: Diagnosis not present

## 2022-05-08 DIAGNOSIS — Z23 Encounter for immunization: Secondary | ICD-10-CM | POA: Diagnosis not present

## 2022-05-31 DIAGNOSIS — Z23 Encounter for immunization: Secondary | ICD-10-CM | POA: Diagnosis not present

## 2022-06-21 ENCOUNTER — Other Ambulatory Visit: Payer: Medicare Other

## 2022-06-21 DIAGNOSIS — T84028A Dislocation of other internal joint prosthesis, initial encounter: Secondary | ICD-10-CM | POA: Diagnosis not present

## 2022-06-21 DIAGNOSIS — S42002A Fracture of unspecified part of left clavicle, initial encounter for closed fracture: Secondary | ICD-10-CM | POA: Diagnosis not present

## 2022-06-28 DIAGNOSIS — S42021A Displaced fracture of shaft of right clavicle, initial encounter for closed fracture: Secondary | ICD-10-CM | POA: Diagnosis not present

## 2022-06-28 DIAGNOSIS — M25511 Pain in right shoulder: Secondary | ICD-10-CM | POA: Diagnosis not present

## 2022-06-28 DIAGNOSIS — M898X1 Other specified disorders of bone, shoulder: Secondary | ICD-10-CM | POA: Diagnosis not present

## 2022-06-29 DIAGNOSIS — S42021A Displaced fracture of shaft of right clavicle, initial encounter for closed fracture: Secondary | ICD-10-CM | POA: Diagnosis not present

## 2022-06-29 DIAGNOSIS — Z96611 Presence of right artificial shoulder joint: Secondary | ICD-10-CM | POA: Diagnosis not present

## 2022-07-20 DIAGNOSIS — S42021D Displaced fracture of shaft of right clavicle, subsequent encounter for fracture with routine healing: Secondary | ICD-10-CM | POA: Diagnosis not present

## 2022-07-20 DIAGNOSIS — Z96611 Presence of right artificial shoulder joint: Secondary | ICD-10-CM | POA: Diagnosis not present

## 2022-07-26 DIAGNOSIS — M542 Cervicalgia: Secondary | ICD-10-CM | POA: Diagnosis not present

## 2022-07-26 DIAGNOSIS — R293 Abnormal posture: Secondary | ICD-10-CM | POA: Diagnosis not present

## 2022-07-26 DIAGNOSIS — S42009A Fracture of unspecified part of unspecified clavicle, initial encounter for closed fracture: Secondary | ICD-10-CM | POA: Diagnosis not present

## 2022-07-27 ENCOUNTER — Other Ambulatory Visit: Payer: Medicare Other

## 2022-07-28 DIAGNOSIS — M542 Cervicalgia: Secondary | ICD-10-CM | POA: Diagnosis not present

## 2022-07-28 DIAGNOSIS — R293 Abnormal posture: Secondary | ICD-10-CM | POA: Diagnosis not present

## 2022-08-02 DIAGNOSIS — M542 Cervicalgia: Secondary | ICD-10-CM | POA: Diagnosis not present

## 2022-08-02 DIAGNOSIS — S42009A Fracture of unspecified part of unspecified clavicle, initial encounter for closed fracture: Secondary | ICD-10-CM | POA: Diagnosis not present

## 2022-08-02 DIAGNOSIS — R293 Abnormal posture: Secondary | ICD-10-CM | POA: Diagnosis not present

## 2022-08-04 DIAGNOSIS — M542 Cervicalgia: Secondary | ICD-10-CM | POA: Diagnosis not present

## 2022-08-04 DIAGNOSIS — S42009A Fracture of unspecified part of unspecified clavicle, initial encounter for closed fracture: Secondary | ICD-10-CM | POA: Diagnosis not present

## 2022-08-04 DIAGNOSIS — R293 Abnormal posture: Secondary | ICD-10-CM | POA: Diagnosis not present

## 2022-08-09 DIAGNOSIS — S42009A Fracture of unspecified part of unspecified clavicle, initial encounter for closed fracture: Secondary | ICD-10-CM | POA: Diagnosis not present

## 2022-08-09 DIAGNOSIS — M542 Cervicalgia: Secondary | ICD-10-CM | POA: Diagnosis not present

## 2022-08-09 DIAGNOSIS — R293 Abnormal posture: Secondary | ICD-10-CM | POA: Diagnosis not present

## 2022-08-11 DIAGNOSIS — M542 Cervicalgia: Secondary | ICD-10-CM | POA: Diagnosis not present

## 2022-08-11 DIAGNOSIS — S42009A Fracture of unspecified part of unspecified clavicle, initial encounter for closed fracture: Secondary | ICD-10-CM | POA: Diagnosis not present

## 2022-08-11 DIAGNOSIS — R293 Abnormal posture: Secondary | ICD-10-CM | POA: Diagnosis not present

## 2022-08-13 DIAGNOSIS — M542 Cervicalgia: Secondary | ICD-10-CM | POA: Diagnosis not present

## 2022-08-13 DIAGNOSIS — R293 Abnormal posture: Secondary | ICD-10-CM | POA: Diagnosis not present

## 2022-08-13 DIAGNOSIS — S42009A Fracture of unspecified part of unspecified clavicle, initial encounter for closed fracture: Secondary | ICD-10-CM | POA: Diagnosis not present

## 2022-08-16 DIAGNOSIS — R293 Abnormal posture: Secondary | ICD-10-CM | POA: Diagnosis not present

## 2022-08-16 DIAGNOSIS — M542 Cervicalgia: Secondary | ICD-10-CM | POA: Diagnosis not present

## 2022-08-16 DIAGNOSIS — S42009A Fracture of unspecified part of unspecified clavicle, initial encounter for closed fracture: Secondary | ICD-10-CM | POA: Diagnosis not present

## 2022-08-17 DIAGNOSIS — H43813 Vitreous degeneration, bilateral: Secondary | ICD-10-CM | POA: Diagnosis not present

## 2022-08-17 DIAGNOSIS — H2513 Age-related nuclear cataract, bilateral: Secondary | ICD-10-CM | POA: Diagnosis not present

## 2022-08-18 ENCOUNTER — Other Ambulatory Visit: Payer: Medicare Other

## 2022-08-18 DIAGNOSIS — S42009A Fracture of unspecified part of unspecified clavicle, initial encounter for closed fracture: Secondary | ICD-10-CM | POA: Diagnosis not present

## 2022-08-18 DIAGNOSIS — R293 Abnormal posture: Secondary | ICD-10-CM | POA: Diagnosis not present

## 2022-08-18 DIAGNOSIS — M542 Cervicalgia: Secondary | ICD-10-CM | POA: Diagnosis not present

## 2022-08-18 DIAGNOSIS — S42021D Displaced fracture of shaft of right clavicle, subsequent encounter for fracture with routine healing: Secondary | ICD-10-CM | POA: Diagnosis not present

## 2022-08-19 ENCOUNTER — Other Ambulatory Visit: Payer: Medicare Other

## 2022-08-25 DIAGNOSIS — M542 Cervicalgia: Secondary | ICD-10-CM | POA: Diagnosis not present

## 2022-08-25 DIAGNOSIS — R293 Abnormal posture: Secondary | ICD-10-CM | POA: Diagnosis not present

## 2022-08-25 DIAGNOSIS — S42009A Fracture of unspecified part of unspecified clavicle, initial encounter for closed fracture: Secondary | ICD-10-CM | POA: Diagnosis not present

## 2022-08-27 DIAGNOSIS — S42009A Fracture of unspecified part of unspecified clavicle, initial encounter for closed fracture: Secondary | ICD-10-CM | POA: Diagnosis not present

## 2022-08-27 DIAGNOSIS — R293 Abnormal posture: Secondary | ICD-10-CM | POA: Diagnosis not present

## 2022-08-27 DIAGNOSIS — M542 Cervicalgia: Secondary | ICD-10-CM | POA: Diagnosis not present

## 2022-08-30 DIAGNOSIS — R293 Abnormal posture: Secondary | ICD-10-CM | POA: Diagnosis not present

## 2022-08-30 DIAGNOSIS — M542 Cervicalgia: Secondary | ICD-10-CM | POA: Diagnosis not present

## 2022-08-30 DIAGNOSIS — S42009A Fracture of unspecified part of unspecified clavicle, initial encounter for closed fracture: Secondary | ICD-10-CM | POA: Diagnosis not present

## 2022-09-01 DIAGNOSIS — R293 Abnormal posture: Secondary | ICD-10-CM | POA: Diagnosis not present

## 2022-09-01 DIAGNOSIS — S42009A Fracture of unspecified part of unspecified clavicle, initial encounter for closed fracture: Secondary | ICD-10-CM | POA: Diagnosis not present

## 2022-09-01 DIAGNOSIS — M542 Cervicalgia: Secondary | ICD-10-CM | POA: Diagnosis not present

## 2022-09-03 DIAGNOSIS — M542 Cervicalgia: Secondary | ICD-10-CM | POA: Diagnosis not present

## 2022-09-03 DIAGNOSIS — R293 Abnormal posture: Secondary | ICD-10-CM | POA: Diagnosis not present

## 2022-09-08 DIAGNOSIS — S42009A Fracture of unspecified part of unspecified clavicle, initial encounter for closed fracture: Secondary | ICD-10-CM | POA: Diagnosis not present

## 2022-09-08 DIAGNOSIS — M542 Cervicalgia: Secondary | ICD-10-CM | POA: Diagnosis not present

## 2022-09-08 DIAGNOSIS — R293 Abnormal posture: Secondary | ICD-10-CM | POA: Diagnosis not present

## 2022-09-10 DIAGNOSIS — R293 Abnormal posture: Secondary | ICD-10-CM | POA: Diagnosis not present

## 2022-09-10 DIAGNOSIS — S42009A Fracture of unspecified part of unspecified clavicle, initial encounter for closed fracture: Secondary | ICD-10-CM | POA: Diagnosis not present

## 2022-09-10 DIAGNOSIS — M542 Cervicalgia: Secondary | ICD-10-CM | POA: Diagnosis not present

## 2022-09-14 ENCOUNTER — Ambulatory Visit
Admission: RE | Admit: 2022-09-14 | Discharge: 2022-09-14 | Disposition: A | Discharge status: Home / Self Care | Payer: No Typology Code available for payment source | Source: Ambulatory Visit | Attending: Internal Medicine | Admitting: Internal Medicine

## 2022-09-14 DIAGNOSIS — E7800 Pure hypercholesterolemia, unspecified: Secondary | ICD-10-CM

## 2022-09-15 DIAGNOSIS — R293 Abnormal posture: Secondary | ICD-10-CM | POA: Diagnosis not present

## 2022-09-15 DIAGNOSIS — S42009A Fracture of unspecified part of unspecified clavicle, initial encounter for closed fracture: Secondary | ICD-10-CM | POA: Diagnosis not present

## 2022-09-15 DIAGNOSIS — M542 Cervicalgia: Secondary | ICD-10-CM | POA: Diagnosis not present

## 2022-09-17 DIAGNOSIS — S42009A Fracture of unspecified part of unspecified clavicle, initial encounter for closed fracture: Secondary | ICD-10-CM | POA: Diagnosis not present

## 2022-09-17 DIAGNOSIS — M542 Cervicalgia: Secondary | ICD-10-CM | POA: Diagnosis not present

## 2022-09-17 DIAGNOSIS — R293 Abnormal posture: Secondary | ICD-10-CM | POA: Diagnosis not present

## 2022-09-20 DIAGNOSIS — S42009A Fracture of unspecified part of unspecified clavicle, initial encounter for closed fracture: Secondary | ICD-10-CM | POA: Diagnosis not present

## 2022-09-20 DIAGNOSIS — R293 Abnormal posture: Secondary | ICD-10-CM | POA: Diagnosis not present

## 2022-09-20 DIAGNOSIS — M542 Cervicalgia: Secondary | ICD-10-CM | POA: Diagnosis not present

## 2022-09-22 DIAGNOSIS — S42009A Fracture of unspecified part of unspecified clavicle, initial encounter for closed fracture: Secondary | ICD-10-CM | POA: Diagnosis not present

## 2022-09-22 DIAGNOSIS — M542 Cervicalgia: Secondary | ICD-10-CM | POA: Diagnosis not present

## 2022-09-22 DIAGNOSIS — I2510 Atherosclerotic heart disease of native coronary artery without angina pectoris: Secondary | ICD-10-CM | POA: Diagnosis not present

## 2022-09-22 DIAGNOSIS — S42021D Displaced fracture of shaft of right clavicle, subsequent encounter for fracture with routine healing: Secondary | ICD-10-CM | POA: Diagnosis not present

## 2022-09-22 DIAGNOSIS — R293 Abnormal posture: Secondary | ICD-10-CM | POA: Diagnosis not present

## 2022-09-24 DIAGNOSIS — R293 Abnormal posture: Secondary | ICD-10-CM | POA: Diagnosis not present

## 2022-09-24 DIAGNOSIS — M542 Cervicalgia: Secondary | ICD-10-CM | POA: Diagnosis not present

## 2022-09-24 DIAGNOSIS — S42009A Fracture of unspecified part of unspecified clavicle, initial encounter for closed fracture: Secondary | ICD-10-CM | POA: Diagnosis not present

## 2022-09-27 DIAGNOSIS — R293 Abnormal posture: Secondary | ICD-10-CM | POA: Diagnosis not present

## 2022-09-27 DIAGNOSIS — S42009A Fracture of unspecified part of unspecified clavicle, initial encounter for closed fracture: Secondary | ICD-10-CM | POA: Diagnosis not present

## 2022-09-27 DIAGNOSIS — M542 Cervicalgia: Secondary | ICD-10-CM | POA: Diagnosis not present

## 2022-09-29 DIAGNOSIS — S42009A Fracture of unspecified part of unspecified clavicle, initial encounter for closed fracture: Secondary | ICD-10-CM | POA: Diagnosis not present

## 2022-09-29 DIAGNOSIS — M542 Cervicalgia: Secondary | ICD-10-CM | POA: Diagnosis not present

## 2022-09-29 DIAGNOSIS — R293 Abnormal posture: Secondary | ICD-10-CM | POA: Diagnosis not present

## 2022-10-01 DIAGNOSIS — M542 Cervicalgia: Secondary | ICD-10-CM | POA: Diagnosis not present

## 2022-10-01 DIAGNOSIS — S42009A Fracture of unspecified part of unspecified clavicle, initial encounter for closed fracture: Secondary | ICD-10-CM | POA: Diagnosis not present

## 2022-10-01 DIAGNOSIS — R293 Abnormal posture: Secondary | ICD-10-CM | POA: Diagnosis not present

## 2022-10-04 DIAGNOSIS — S42009A Fracture of unspecified part of unspecified clavicle, initial encounter for closed fracture: Secondary | ICD-10-CM | POA: Diagnosis not present

## 2022-10-04 DIAGNOSIS — R293 Abnormal posture: Secondary | ICD-10-CM | POA: Diagnosis not present

## 2022-10-04 DIAGNOSIS — M542 Cervicalgia: Secondary | ICD-10-CM | POA: Diagnosis not present

## 2022-10-06 DIAGNOSIS — R293 Abnormal posture: Secondary | ICD-10-CM | POA: Diagnosis not present

## 2022-10-06 DIAGNOSIS — M542 Cervicalgia: Secondary | ICD-10-CM | POA: Diagnosis not present

## 2022-10-06 DIAGNOSIS — S42009A Fracture of unspecified part of unspecified clavicle, initial encounter for closed fracture: Secondary | ICD-10-CM | POA: Diagnosis not present

## 2022-10-08 DIAGNOSIS — R293 Abnormal posture: Secondary | ICD-10-CM | POA: Diagnosis not present

## 2022-10-08 DIAGNOSIS — S42009A Fracture of unspecified part of unspecified clavicle, initial encounter for closed fracture: Secondary | ICD-10-CM | POA: Diagnosis not present

## 2022-10-08 DIAGNOSIS — M542 Cervicalgia: Secondary | ICD-10-CM | POA: Diagnosis not present

## 2022-10-11 ENCOUNTER — Ambulatory Visit: Payer: Medicare Other | Admitting: Cardiology

## 2022-10-11 ENCOUNTER — Encounter: Payer: Self-pay | Admitting: Cardiology

## 2022-10-11 VITALS — BP 120/76 | HR 73 | Ht 61.0 in | Wt 115.8 lb

## 2022-10-11 DIAGNOSIS — Z8669 Personal history of other diseases of the nervous system and sense organs: Secondary | ICD-10-CM | POA: Diagnosis not present

## 2022-10-11 DIAGNOSIS — E7800 Pure hypercholesterolemia, unspecified: Secondary | ICD-10-CM

## 2022-10-11 DIAGNOSIS — M542 Cervicalgia: Secondary | ICD-10-CM | POA: Diagnosis not present

## 2022-10-11 DIAGNOSIS — R9431 Abnormal electrocardiogram [ECG] [EKG]: Secondary | ICD-10-CM | POA: Diagnosis not present

## 2022-10-11 DIAGNOSIS — R293 Abnormal posture: Secondary | ICD-10-CM | POA: Diagnosis not present

## 2022-10-11 DIAGNOSIS — R931 Abnormal findings on diagnostic imaging of heart and coronary circulation: Secondary | ICD-10-CM

## 2022-10-11 DIAGNOSIS — S42009A Fracture of unspecified part of unspecified clavicle, initial encounter for closed fracture: Secondary | ICD-10-CM | POA: Diagnosis not present

## 2022-10-11 NOTE — Progress Notes (Signed)
Primary Physician/Referring:  Deland Pretty, MD  Patient ID: Kelsey Setting, MD, female    DOB: 06-21-1951, 72 y.o.   MRN: [id number]  Chief Complaint  Patient presents with   Coronary Artery Disease   New Patient (Initial Visit)   HPI:    Kelsey Setting, MD  is a 71 y.o. Caucasian female patient, a retired Paediatric nurse with longstanding hypercholesterolemia, strong family history of premature artery disease, brother had CABG in his late 38s and also paternal grandfather also had CABG in his 23s, referred to me for cardiac risk stratification in view of elevated coronary calcium score.  She remains asymptomatic  Past Medical History:  Diagnosis Date   Anemia    history of   GERD (gastroesophageal reflux disease)    History of blood transfusion 10/09/2013   History of Clostridium difficile colitis    Hypercholesteremia    Hyperlipidemia    Hypothyroidism    Leg pain    weakness, generalized, history of   Osteoarthritis    rt hip,    Thyroid disease    Past Surgical History:  Procedure Laterality Date   BILATERAL ANTERIOR TOTAL HIP ARTHROPLASTY Bilateral 10/09/2013   Procedure: BILATERAL ANTERIOR TOTAL HIP ARTHROPLASTY;  Surgeon: Mcarthur Rossetti, MD;  Location: [facility];  Service: Orthopedics;  Laterality: Bilateral;   COLONOSCOPY     CYSTOCELE REPAIR N/A 09/01/2017   Procedure: ANTERIOR REPAIR (CYSTOCELE);  Surgeon: Bjorn Loser, MD;  Location: [facility];  Service: Urology;  Laterality: N/A;   CYSTOSCOPY N/A 09/01/2017   Procedure: CYSTOSCOPY;  Surgeon: Bjorn Loser, MD;  Location: [facility];  Service: Urology;  Laterality: N/A;   DILATION AND CURETTAGE OF UTERUS  1989   for miscarriage   INGUINAL HERNIA REPAIR  09/01/2012   Procedure: HERNIA REPAIR INGUINAL ADULT;  Surgeon: Pedro Earls, MD;  Location: [facility];  Service: General;  Laterality: Right;  laparoscopic assisted   INSERTION OF MESH  09/01/2012   Procedure: INSERTION OF MESH;  Surgeon:  Pedro Earls, MD;  Location: [facility];  Service: General;  Laterality: Right;  spigelian and inguinal   LAPAROSCOPIC ABDOMINAL EXPLORATION  09/01/2012   Procedure: LAPAROSCOPIC ABDOMINAL EXPLORATION;  Surgeon: Pedro Earls, MD;  Location: [facility];  Service: General;  Laterality: N/A;   LAPAROSCOPIC VAGINAL HYSTERECTOMY WITH SALPINGO OOPHORECTOMY Bilateral 09/01/2017   Procedure: LAPAROSCOPIC ASSISTED VAGINAL HYSTERECTOMY WITH  BILATERAL SALPINGO OOPHORECTOMY;  Surgeon: Servando Salina, MD;  Location: [facility];  Service: Gynecology;  Laterality: Bilateral;   SPIGELIAN HERNIA  09/01/2012   Procedure: SPIGELIAN HERNIA;  Surgeon: Pedro Earls, MD;  Location: [facility];  Service: General;  Laterality: Right;  laparoscopic assisted   VAGINAL PROLAPSE REPAIR N/A 09/01/2017   Procedure: VAGINAL VAULT SUSPENSION AND GRAFT;  Surgeon: Bjorn Loser, MD;  Location: [facility];  Service: Urology;  Laterality: N/A;   Family History  Problem Relation Age of Onset   Cancer Mother        breast   Stroke Mother    Breast cancer Mother    Diabetes Father    Multiple sclerosis Sister    Multiple sclerosis Maternal Aunt     Social History   Tobacco Use   Smoking status: Never   Smokeless tobacco: Never  Substance Use Topics   Alcohol use: Yes    Comment: rare   Marital Status: Divorced  ROS  Review of Systems  Cardiovascular:  Negative for chest pain, dyspnea on exertion and leg swelling.   Objective  10/11/2022    1:25 PM 09/02/2017    7:44 AM 09/02/2017    5:24 AM  Vitals with BMI  Height '5\' 1"'$     Weight 115 lbs 13 oz    BMI 38.18    Systolic 299 91 371  Diastolic 76 55 54  Pulse 73 68 81   SpO2: 96 %  Physical Exam Neck:     Vascular: No carotid bruit or JVD.  Cardiovascular:     Rate and Rhythm: Normal rate and regular rhythm.     Pulses: Intact distal pulses.     Heart sounds: Normal heart sounds. No murmur heard.    No gallop.  Pulmonary:     Effort: Pulmonary  effort is normal.     Breath sounds: Normal breath sounds.  Abdominal:     General: Bowel sounds are normal.     Palpations: Abdomen is soft.  Musculoskeletal:     Right lower leg: No edema.     Left lower leg: No edema.     Medications and allergies  No Known Allergies   Medication list   Current Outpatient Medications:    aspirin EC 81 MG tablet, Take 81 mg by mouth daily. Swallow whole., Disp: , Rfl:    Calcium Carbonate (CALCIUM 600 PO), Take 600 mg by mouth., Disp: , Rfl:    Cholecalciferol (VITAMIN D3) 2000 units TABS, Take 2,000 Units by mouth., Disp: , Rfl:    ezetimibe (ZETIA) 10 MG tablet, Take 10 mg by mouth daily., Disp: , Rfl:    ibuprofen (ADVIL,MOTRIN) 800 MG tablet, Take 1 tablet (800 mg total) by mouth every 8 (eight) hours as needed (mild pain)., Disp: 30 tablet, Rfl: 0   levothyroxine (SYNTHROID, LEVOTHROID) 50 MCG tablet, Take 50 mcg by mouth daily before breakfast. , Disp: , Rfl:    Multiple Vitamin (MULTIVITAMIN) tablet, Take 1 tablet by mouth daily., Disp: , Rfl:    omeprazole (PRILOSEC) 20 MG capsule, Take 20 mg by mouth daily., Disp: , Rfl:    pravastatin (PRAVACHOL) 80 MG tablet, Take 80 mg by mouth daily., Disp: , Rfl:    rosuvastatin (CRESTOR) 20 MG tablet, Take 20 mg by mouth daily., Disp: , Rfl:  Laboratory examination:   External labs:   Labs 04/13/2022:  Sodium 141, potassium 4.3, BUN 21, creatinine 0.70, LFTs normal, EGFR 86 mL.  Hb 13.4/HCT 40.1, platelets 213, normal indicis.  TSH normal at 2.23.  Total cholesterol 142, triglycerides 90, HDL 44, LDL 88.  Non-HDL cholesterol 98.  Radiology:    Cardiac Studies:   Carotid artery duplex 03-09-20: Right Carotid: Velocities in the right ICA are consistent with a 1-39%  stenosis, low end of range. Minimal plaque visualized.   Left Carotid: Velocities in the left ICA are consistent with a 1-39%  stenosis, low end of range. Minimal plaque visualized.   Vertebrals:  Bilateral vertebral  arteries demonstrate antegrade flow.  Subclavians: Normal flow hemodynamics were seen in bilateral subclavian arteries.   Coronary calcium score 09/14/2022: LM 0 LAD 57.4 LCx 0 RCA 299. Total Agagston stent score 357, MESA database percentile: 84   EKG:   EKG 10/11/2022: Normal sinus rhythm with rate of 73 bpm, normal axis, poor R wave progression, probably normal variant.  T wave abnormality, cannot exclude inferior ischemia.  No prior EKG to compare.    Assessment     ICD-10-CM   1. Elevated coronary artery calcium score 09/14/2022: Total Agagston score 357, MESA database percentile: 84  R93.1 EKG  12-Lead    2. Nonspecific abnormal electrocardiogram (ECG) (EKG)  R94.31     3. Pure hypercholesterolemia  E78.00     4. H/O amaurosis fugax 2021  Z86.69        Orders Placed This Encounter  Procedures   EKG 12-Lead    No orders of the defined types were placed in this encounter.  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  1. Elevated coronary artery calcium score 09/14/2022: Total Agagston score 357, MESA database percentile: 80 Patient's coronary calcium score in the 84th percentile, LDL is not at goal.  Although 72 years of age, she is presently doing well and is fairly active, remains angina free.  2. Nonspecific abnormal electrocardiogram (ECG) (EKG) She has nonspecific abnormal EKG with T wave  abnormality in the inferior leads says having for ischemia, the coronary calcium score is also maximum in the right coronary artery distribution.  In view of this, I will schedule her for a exercise nuclear stress test and also an echocardiogram to evaluate for ischemia and also wall motion abnormality.  3. Pure hypercholesterolemia She has been on statins for a long years, she is also been on ezetimibe, LDL reviewed, >70.  However her only risk factor is a family history and hypercholesterolemia and in the absence of diabetes and also smoking, we could certainly accept the LDL goal <100 or could consider reducing it further depending upon the results of the stress test and echocardiogram.  I will make recommendation regarding this after the tests are available.  If the tests are indeed normal, we could certainly consider placing her in the clinical trials.  It would also be interesting to know what her LPA would be.  4. H/O amaurosis fugax 2021 She has had amaurosis fugax, fairly classic symptoms in 2021 on the left eye.  No recurrence.  Carotid artery duplex from external source reviewed, no significant carotid disease.  On exam today there is no carotid bruit.  Her vascular examination is normal.    Adrian Prows, MD,  [facility] 10/11/2022, 2:10 PM Office: [phone]

## 2022-10-13 ENCOUNTER — Ambulatory Visit: Payer: Medicare Other

## 2022-10-13 DIAGNOSIS — R9431 Abnormal electrocardiogram [ECG] [EKG]: Secondary | ICD-10-CM

## 2022-10-13 DIAGNOSIS — R931 Abnormal findings on diagnostic imaging of heart and coronary circulation: Secondary | ICD-10-CM | POA: Diagnosis not present

## 2022-10-13 DIAGNOSIS — R293 Abnormal posture: Secondary | ICD-10-CM | POA: Diagnosis not present

## 2022-10-13 DIAGNOSIS — M542 Cervicalgia: Secondary | ICD-10-CM | POA: Diagnosis not present

## 2022-10-13 DIAGNOSIS — S42009A Fracture of unspecified part of unspecified clavicle, initial encounter for closed fracture: Secondary | ICD-10-CM | POA: Diagnosis not present

## 2022-10-15 DIAGNOSIS — R293 Abnormal posture: Secondary | ICD-10-CM | POA: Diagnosis not present

## 2022-10-15 DIAGNOSIS — M542 Cervicalgia: Secondary | ICD-10-CM | POA: Diagnosis not present

## 2022-10-15 DIAGNOSIS — S42009A Fracture of unspecified part of unspecified clavicle, initial encounter for closed fracture: Secondary | ICD-10-CM | POA: Diagnosis not present

## 2022-10-18 DIAGNOSIS — R293 Abnormal posture: Secondary | ICD-10-CM | POA: Diagnosis not present

## 2022-10-18 DIAGNOSIS — M542 Cervicalgia: Secondary | ICD-10-CM | POA: Diagnosis not present

## 2022-10-18 DIAGNOSIS — S42009A Fracture of unspecified part of unspecified clavicle, initial encounter for closed fracture: Secondary | ICD-10-CM | POA: Diagnosis not present

## 2022-10-20 DIAGNOSIS — S42009A Fracture of unspecified part of unspecified clavicle, initial encounter for closed fracture: Secondary | ICD-10-CM | POA: Diagnosis not present

## 2022-10-20 DIAGNOSIS — M542 Cervicalgia: Secondary | ICD-10-CM | POA: Diagnosis not present

## 2022-10-20 DIAGNOSIS — R293 Abnormal posture: Secondary | ICD-10-CM | POA: Diagnosis not present

## 2022-10-22 DIAGNOSIS — S42009A Fracture of unspecified part of unspecified clavicle, initial encounter for closed fracture: Secondary | ICD-10-CM | POA: Diagnosis not present

## 2022-10-22 DIAGNOSIS — R293 Abnormal posture: Secondary | ICD-10-CM | POA: Diagnosis not present

## 2022-10-22 DIAGNOSIS — M542 Cervicalgia: Secondary | ICD-10-CM | POA: Diagnosis not present

## 2022-10-25 ENCOUNTER — Other Ambulatory Visit: Payer: Self-pay

## 2022-10-25 ENCOUNTER — Ambulatory Visit: Payer: Medicare Other

## 2022-10-25 DIAGNOSIS — R931 Abnormal findings on diagnostic imaging of heart and coronary circulation: Secondary | ICD-10-CM | POA: Diagnosis not present

## 2022-10-25 DIAGNOSIS — R9431 Abnormal electrocardiogram [ECG] [EKG]: Secondary | ICD-10-CM | POA: Diagnosis not present

## 2022-10-26 DIAGNOSIS — S42009A Fracture of unspecified part of unspecified clavicle, initial encounter for closed fracture: Secondary | ICD-10-CM | POA: Diagnosis not present

## 2022-10-26 DIAGNOSIS — R293 Abnormal posture: Secondary | ICD-10-CM | POA: Diagnosis not present

## 2022-10-26 DIAGNOSIS — M542 Cervicalgia: Secondary | ICD-10-CM | POA: Diagnosis not present

## 2022-10-29 DIAGNOSIS — R293 Abnormal posture: Secondary | ICD-10-CM | POA: Diagnosis not present

## 2022-10-29 DIAGNOSIS — S42009A Fracture of unspecified part of unspecified clavicle, initial encounter for closed fracture: Secondary | ICD-10-CM | POA: Diagnosis not present

## 2022-10-29 DIAGNOSIS — M542 Cervicalgia: Secondary | ICD-10-CM | POA: Diagnosis not present

## 2022-11-01 ENCOUNTER — Ambulatory Visit: Payer: Medicare Other | Admitting: Cardiology

## 2022-11-01 ENCOUNTER — Encounter: Payer: Self-pay | Admitting: Cardiology

## 2022-11-01 VITALS — BP 127/83 | HR 73 | Resp 16 | Ht 61.0 in | Wt 114.6 lb

## 2022-11-01 DIAGNOSIS — R293 Abnormal posture: Secondary | ICD-10-CM | POA: Diagnosis not present

## 2022-11-01 DIAGNOSIS — E7800 Pure hypercholesterolemia, unspecified: Secondary | ICD-10-CM | POA: Diagnosis not present

## 2022-11-01 DIAGNOSIS — R9439 Abnormal result of other cardiovascular function study: Secondary | ICD-10-CM

## 2022-11-01 DIAGNOSIS — R931 Abnormal findings on diagnostic imaging of heart and coronary circulation: Secondary | ICD-10-CM

## 2022-11-01 DIAGNOSIS — Z8669 Personal history of other diseases of the nervous system and sense organs: Secondary | ICD-10-CM

## 2022-11-01 DIAGNOSIS — M542 Cervicalgia: Secondary | ICD-10-CM | POA: Diagnosis not present

## 2022-11-01 DIAGNOSIS — S42009A Fracture of unspecified part of unspecified clavicle, initial encounter for closed fracture: Secondary | ICD-10-CM | POA: Diagnosis not present

## 2022-11-01 MED ORDER — ROSUVASTATIN CALCIUM 40 MG PO TABS: 40.0000 mg | ORAL_TABLET | ORAL | 3 refills | Status: AC

## 2022-11-01 NOTE — Progress Notes (Signed)
Primary Physician/Referring:  Deland Pretty, MD  Patient ID: Kelsey Setting, MD, female    DOB: [birth year], 72 y.o.   MRN: [id number]  Chief Complaint  Patient presents with   Hyperlipidemia   Results    Echocardiogram and stress tests   HPI:    [facility] Kelsey Lauth, MD  is a 72 y.o. Caucasian female patient, a retired Paediatric nurse with longstanding hypercholesterolemia, strong family history of premature artery disease, brother had CABG in his late 65s and also paternal grandfather also had CABG in his 50s, referred to me for cardiac risk stratification in view of elevated coronary calcium score.  She remains asymptomatic.  Underwent echocardiogram and nuclear stress test and presents for follow-up.  Past Medical History:  Diagnosis Date   Anemia    history of   GERD (gastroesophageal reflux disease)    History of blood transfusion 10/09/2013   History of Clostridium difficile colitis    Hypercholesteremia    Hyperlipidemia    Hypothyroidism    Leg pain    weakness, generalized, history of   Osteoarthritis    rt hip,    Thyroid disease    Past Surgical History:  Procedure Laterality Date   BILATERAL ANTERIOR TOTAL HIP ARTHROPLASTY Bilateral 10/09/2013   Procedure: BILATERAL ANTERIOR TOTAL HIP ARTHROPLASTY;  Surgeon: Mcarthur Rossetti, MD;  Location: [place];  Service: Orthopedics;  Laterality: Bilateral;   COLONOSCOPY     CYSTOCELE REPAIR N/A 09/01/2017   Procedure: ANTERIOR REPAIR (CYSTOCELE);  Surgeon: Bjorn Loser, MD;  Location: [facility];  Service: Urology;  Laterality: N/A;   CYSTOSCOPY N/A 09/01/2017   Procedure: CYSTOSCOPY;  Surgeon: Bjorn Loser, MD;  Location: [facility];  Service: Urology;  Laterality: N/A;   DILATION AND CURETTAGE OF UTERUS  1989   for miscarriage   INGUINAL HERNIA REPAIR  09/01/2012   Procedure: HERNIA REPAIR INGUINAL ADULT;  Surgeon: Pedro Earls, MD;  Location: [facility];  Service: General;  Laterality: Right;  laparoscopic  assisted   INSERTION OF MESH  09/01/2012   Procedure: INSERTION OF MESH;  Surgeon: Pedro Earls, MD;  Location: [facility];  Service: General;  Laterality: Right;  spigelian and inguinal   LAPAROSCOPIC ABDOMINAL EXPLORATION  09/01/2012   Procedure: LAPAROSCOPIC ABDOMINAL EXPLORATION;  Surgeon: Pedro Earls, MD;  Location: [facility];  Service: General;  Laterality: N/A;   LAPAROSCOPIC VAGINAL HYSTERECTOMY WITH SALPINGO OOPHORECTOMY Bilateral 09/01/2017   Procedure: LAPAROSCOPIC ASSISTED VAGINAL HYSTERECTOMY WITH  BILATERAL SALPINGO OOPHORECTOMY;  Surgeon: Servando Salina, MD;  Location: [facility];  Service: Gynecology;  Laterality: Bilateral;   SPIGELIAN HERNIA  09/01/2012   Procedure: SPIGELIAN HERNIA;  Surgeon: Pedro Earls, MD;  Location: [facility];  Service: General;  Laterality: Right;  laparoscopic assisted   VAGINAL PROLAPSE REPAIR N/A 09/01/2017   Procedure: VAGINAL VAULT SUSPENSION AND GRAFT;  Surgeon: Bjorn Loser, MD;  Location: [facility];  Service: Urology;  Laterality: N/A;   Family History  Problem Relation Age of Onset   Cancer Mother        breast   Stroke Mother    Breast cancer Mother    Diabetes Father    Multiple sclerosis Sister    Multiple sclerosis Maternal Aunt     Social History   Tobacco Use   Smoking status: Never   Smokeless tobacco: Never  Substance Use Topics   Alcohol use: Yes    Comment: rare   Marital Status: Divorced  ROS  Review of Systems  Cardiovascular:  Negative  for chest pain, dyspnea on exertion and leg swelling.   Objective      11/01/2022    3:31 PM 10/11/2022    1:25 PM 09/02/2017    7:44 AM  Vitals with BMI  Height 5' 1"$  5' 1"$    Weight 114 lbs 10 oz 115 lbs 13 oz   BMI [id number]   Systolic [id number]  Diastolic 83 76 55  Pulse 73 73 68   SpO2: 97 %  Physical Exam Neck:     Vascular: No carotid bruit or JVD.  Cardiovascular:     Rate and Rhythm: Normal rate and regular rhythm.     Pulses: Intact distal pulses.      Heart sounds: Normal heart sounds. No murmur heard.    No gallop.  Pulmonary:     Effort: Pulmonary effort is normal.     Breath sounds: Normal breath sounds.  Abdominal:     General: Bowel sounds are normal.     Palpations: Abdomen is soft.  Musculoskeletal:     Right lower leg: No edema.     Left lower leg: No edema.     Medications and allergies  No Known Allergies   Medication list   Current Outpatient Medications:    aspirin EC 81 MG tablet, Take 81 mg by mouth daily. Swallow whole., Disp: , Rfl:    Calcium Carbonate (CALCIUM 600 PO), Take 600 mg by mouth., Disp: , Rfl:    Cholecalciferol (VITAMIN D3) 2000 units TABS, Take 2,000 Units by mouth., Disp: , Rfl:    ezetimibe (ZETIA) 10 MG tablet, Take 10 mg by mouth daily., Disp: , Rfl:    ibuprofen (ADVIL,MOTRIN) 800 MG tablet, Take 1 tablet (800 mg total) by mouth every 8 (eight) hours as needed (mild pain)., Disp: 30 tablet, Rfl: 0   levothyroxine (SYNTHROID, LEVOTHROID) 50 MCG tablet, Take 50 mcg by mouth daily before breakfast. , Disp: , Rfl:    Multiple Vitamin (MULTIVITAMIN) tablet, Take 1 tablet by mouth daily., Disp: , Rfl:    omeprazole (PRILOSEC) 20 MG capsule, Take 20 mg by mouth daily., Disp: , Rfl:    rosuvastatin (CRESTOR) 40 MG tablet, Take 1 tablet (40 mg total) by mouth as directed. Take 20 mg and 40 mg alternating daily, Disp: 90 tablet, Rfl: 3 Laboratory examination:   External labs:   Labs 04/13/2022:  Sodium 141, potassium 4.3, BUN 21, creatinine 0.70, LFTs normal, EGFR 86 mL.  Hb 13.4/HCT 40.1, platelets 213, normal indicis.  TSH normal at 2.23.  Total cholesterol 142, triglycerides 90, HDL 44, LDL 88.  Non-HDL cholesterol 98.  Radiology:    Cardiac Studies:   Carotid artery duplex Mar 29, 2020: Right Carotid: Velocities in the right ICA are consistent with a 1-39%  stenosis, low end of range. Minimal plaque visualized.   Left Carotid: Velocities in the left ICA are consistent with a 1-39%   stenosis, low end of range. Minimal plaque visualized.   Vertebrals:  Bilateral vertebral arteries demonstrate antegrade flow.  Subclavians: Normal flow hemodynamics were seen in bilateral subclavian arteries.   Coronary calcium score 09/14/2022: LM 0 LAD 57.4 LCx 0 RCA 299. Total Agagston stent score 357, MESA database percentile: 84   PCV ECHOCARDIOGRAM COMPLETE 10/13/2022  Narrative Echocardiogram 10/13/2022: Hyperdynamic LV systolic function with visual EF >70%. Left ventricle cavity is normal in size. Normal left ventricular wall thickness. Presence of a septal bulge. Normal global wall motion. Normal diastolic filling pattern, normal LAP. Mild pulmonic regurgitation. No prior  study for comparison.    PCV MYOCARDIAL PERFUSION WO LEXISCAN 10/25/2022  Narrative Exercise Tetrofosmin stress test 10/25/2022: Exercise nuclear stress test was performed using Bruce protocol. Patient reached 7 METS, and 90% of age predicted maximum heart rate. Exercise capacity was fair. No chest pain reported. Heart rate and hemodynamic response were normal. Stress EKG revealed no ischemic changes. SPECT images show very small sized, mild intensity, reversible, apical perfusion defect. Normal myocardial wall motion and thickening. Stress LVEF 61%. Low risk study.   EKG:   EKG 10/11/2022: Normal sinus rhythm with rate of 73 bpm, normal axis, poor R wave progression, probably normal variant.  T wave abnormality, cannot exclude inferior ischemia.  No prior EKG to compare.    Assessment     ICD-10-CM   1. Abnormal nuclear stress test  R94.39     2. Elevated coronary artery calcium score 09/14/2022: Total Agagston score 357, MESA database percentile: 84  R93.1 rosuvastatin (CRESTOR) 40 MG tablet    3. Pure hypercholesterolemia  E78.00 rosuvastatin (CRESTOR) 40 MG tablet    Lipoprotein A (LPA)    Lipid Panel With LDL/HDL Ratio    4. H/O amaurosis fugax 2021  Z86.69        Orders Placed This  Encounter  Procedures   Lipoprotein A (LPA)   Lipid Panel With LDL/HDL Ratio    Meds ordered this encounter  Medications   rosuvastatin (CRESTOR) 40 MG tablet    Sig: Take 1 tablet (40 mg total) by mouth as directed. 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  1. Abnormal nuclear stress test I reviewed the results of the stress test, she has very small sized apical ischemia.  As she is essentially asymptomatic, continued medical therapy is indicated.  Echocardiogram reveals no significant valvular abnormality and preserved LVEF.  She will continue with aspirin 81 mg daily.  2. Elevated coronary artery calcium score 09/14/2022: Total Agagston score 357, MESA database percentile: 73 Cardiac restratification performed, she is overall low risk.  3. Pure hypercholesterolemia Presently on 20 mg of Crestor and also 10 mg of Zetia, LDL is not at goal.  Will increase Crestor to 20 mg and 40 mg alternating every other day and recheck lipids in about 2 months.  If LDL is not controlled, her next option will be either addition of nonstatin bempedoic acid versus starting Repatha.  4. H/O amaurosis fugax 2021 Remote history of amaurosis fugax, normal carotid arteries, normal vascular exam.  Continue aspirin.  I will see her back in 6 months, if she remains stable, I will see her back on a as needed basis.    Adrian Prows, MD, [facility] 11/01/2022, 6:08 PM Office: [phone]

## 2022-11-03 DIAGNOSIS — S42009A Fracture of unspecified part of unspecified clavicle, initial encounter for closed fracture: Secondary | ICD-10-CM | POA: Diagnosis not present

## 2022-11-03 DIAGNOSIS — R293 Abnormal posture: Secondary | ICD-10-CM | POA: Diagnosis not present

## 2022-11-03 DIAGNOSIS — M542 Cervicalgia: Secondary | ICD-10-CM | POA: Diagnosis not present

## 2022-11-04 DIAGNOSIS — Z1231 Encounter for screening mammogram for malignant neoplasm of breast: Secondary | ICD-10-CM | POA: Diagnosis not present

## 2022-11-05 DIAGNOSIS — M542 Cervicalgia: Secondary | ICD-10-CM | POA: Diagnosis not present

## 2022-11-05 DIAGNOSIS — S42009A Fracture of unspecified part of unspecified clavicle, initial encounter for closed fracture: Secondary | ICD-10-CM | POA: Diagnosis not present

## 2022-11-05 DIAGNOSIS — R293 Abnormal posture: Secondary | ICD-10-CM | POA: Diagnosis not present

## 2022-11-08 DIAGNOSIS — S42009A Fracture of unspecified part of unspecified clavicle, initial encounter for closed fracture: Secondary | ICD-10-CM | POA: Diagnosis not present

## 2022-11-08 DIAGNOSIS — M542 Cervicalgia: Secondary | ICD-10-CM | POA: Diagnosis not present

## 2022-11-08 DIAGNOSIS — R293 Abnormal posture: Secondary | ICD-10-CM | POA: Diagnosis not present

## 2022-11-16 DIAGNOSIS — S42009A Fracture of unspecified part of unspecified clavicle, initial encounter for closed fracture: Secondary | ICD-10-CM | POA: Diagnosis not present

## 2022-11-16 DIAGNOSIS — M542 Cervicalgia: Secondary | ICD-10-CM | POA: Diagnosis not present

## 2022-11-16 DIAGNOSIS — R293 Abnormal posture: Secondary | ICD-10-CM | POA: Diagnosis not present

## 2022-11-18 DIAGNOSIS — M542 Cervicalgia: Secondary | ICD-10-CM | POA: Diagnosis not present

## 2022-11-18 DIAGNOSIS — S42009A Fracture of unspecified part of unspecified clavicle, initial encounter for closed fracture: Secondary | ICD-10-CM | POA: Diagnosis not present

## 2022-11-18 DIAGNOSIS — R293 Abnormal posture: Secondary | ICD-10-CM | POA: Diagnosis not present

## 2022-11-22 DIAGNOSIS — R293 Abnormal posture: Secondary | ICD-10-CM | POA: Diagnosis not present

## 2022-11-22 DIAGNOSIS — M542 Cervicalgia: Secondary | ICD-10-CM | POA: Diagnosis not present

## 2022-11-22 DIAGNOSIS — S42009A Fracture of unspecified part of unspecified clavicle, initial encounter for closed fracture: Secondary | ICD-10-CM | POA: Diagnosis not present

## 2022-11-24 DIAGNOSIS — M542 Cervicalgia: Secondary | ICD-10-CM | POA: Diagnosis not present

## 2022-11-24 DIAGNOSIS — R293 Abnormal posture: Secondary | ICD-10-CM | POA: Diagnosis not present

## 2022-11-24 DIAGNOSIS — S42009A Fracture of unspecified part of unspecified clavicle, initial encounter for closed fracture: Secondary | ICD-10-CM | POA: Diagnosis not present

## 2022-12-01 DIAGNOSIS — M542 Cervicalgia: Secondary | ICD-10-CM | POA: Diagnosis not present

## 2022-12-01 DIAGNOSIS — S42009A Fracture of unspecified part of unspecified clavicle, initial encounter for closed fracture: Secondary | ICD-10-CM | POA: Diagnosis not present

## 2022-12-01 DIAGNOSIS — R293 Abnormal posture: Secondary | ICD-10-CM | POA: Diagnosis not present

## 2022-12-03 DIAGNOSIS — M542 Cervicalgia: Secondary | ICD-10-CM | POA: Diagnosis not present

## 2022-12-03 DIAGNOSIS — R293 Abnormal posture: Secondary | ICD-10-CM | POA: Diagnosis not present

## 2022-12-03 DIAGNOSIS — S42009A Fracture of unspecified part of unspecified clavicle, initial encounter for closed fracture: Secondary | ICD-10-CM | POA: Diagnosis not present

## 2022-12-06 DIAGNOSIS — R293 Abnormal posture: Secondary | ICD-10-CM | POA: Diagnosis not present

## 2022-12-06 DIAGNOSIS — S42009A Fracture of unspecified part of unspecified clavicle, initial encounter for closed fracture: Secondary | ICD-10-CM | POA: Diagnosis not present

## 2022-12-06 DIAGNOSIS — M542 Cervicalgia: Secondary | ICD-10-CM | POA: Diagnosis not present

## 2022-12-08 DIAGNOSIS — S42021A Displaced fracture of shaft of right clavicle, initial encounter for closed fracture: Secondary | ICD-10-CM | POA: Diagnosis not present

## 2022-12-08 DIAGNOSIS — M542 Cervicalgia: Secondary | ICD-10-CM | POA: Diagnosis not present

## 2022-12-08 DIAGNOSIS — S42009A Fracture of unspecified part of unspecified clavicle, initial encounter for closed fracture: Secondary | ICD-10-CM | POA: Diagnosis not present

## 2022-12-08 DIAGNOSIS — R293 Abnormal posture: Secondary | ICD-10-CM | POA: Diagnosis not present

## 2022-12-10 DIAGNOSIS — M542 Cervicalgia: Secondary | ICD-10-CM | POA: Diagnosis not present

## 2022-12-10 DIAGNOSIS — S42009A Fracture of unspecified part of unspecified clavicle, initial encounter for closed fracture: Secondary | ICD-10-CM | POA: Diagnosis not present

## 2022-12-10 DIAGNOSIS — R293 Abnormal posture: Secondary | ICD-10-CM | POA: Diagnosis not present

## 2022-12-13 DIAGNOSIS — R293 Abnormal posture: Secondary | ICD-10-CM | POA: Diagnosis not present

## 2022-12-13 DIAGNOSIS — M542 Cervicalgia: Secondary | ICD-10-CM | POA: Diagnosis not present

## 2022-12-13 DIAGNOSIS — S42009A Fracture of unspecified part of unspecified clavicle, initial encounter for closed fracture: Secondary | ICD-10-CM | POA: Diagnosis not present

## 2022-12-15 DIAGNOSIS — M542 Cervicalgia: Secondary | ICD-10-CM | POA: Diagnosis not present

## 2022-12-15 DIAGNOSIS — R293 Abnormal posture: Secondary | ICD-10-CM | POA: Diagnosis not present

## 2022-12-15 DIAGNOSIS — S42009A Fracture of unspecified part of unspecified clavicle, initial encounter for closed fracture: Secondary | ICD-10-CM | POA: Diagnosis not present

## 2022-12-17 DIAGNOSIS — R293 Abnormal posture: Secondary | ICD-10-CM | POA: Diagnosis not present

## 2022-12-17 DIAGNOSIS — S42009A Fracture of unspecified part of unspecified clavicle, initial encounter for closed fracture: Secondary | ICD-10-CM | POA: Diagnosis not present

## 2022-12-17 DIAGNOSIS — M542 Cervicalgia: Secondary | ICD-10-CM | POA: Diagnosis not present

## 2022-12-20 DIAGNOSIS — R293 Abnormal posture: Secondary | ICD-10-CM | POA: Diagnosis not present

## 2022-12-20 DIAGNOSIS — S42009A Fracture of unspecified part of unspecified clavicle, initial encounter for closed fracture: Secondary | ICD-10-CM | POA: Diagnosis not present

## 2022-12-20 DIAGNOSIS — M542 Cervicalgia: Secondary | ICD-10-CM | POA: Diagnosis not present

## 2022-12-22 DIAGNOSIS — R293 Abnormal posture: Secondary | ICD-10-CM | POA: Diagnosis not present

## 2022-12-22 DIAGNOSIS — S42009A Fracture of unspecified part of unspecified clavicle, initial encounter for closed fracture: Secondary | ICD-10-CM | POA: Diagnosis not present

## 2022-12-22 DIAGNOSIS — M542 Cervicalgia: Secondary | ICD-10-CM | POA: Diagnosis not present

## 2022-12-24 DIAGNOSIS — M542 Cervicalgia: Secondary | ICD-10-CM | POA: Diagnosis not present

## 2022-12-24 DIAGNOSIS — R293 Abnormal posture: Secondary | ICD-10-CM | POA: Diagnosis not present

## 2022-12-24 DIAGNOSIS — S42009A Fracture of unspecified part of unspecified clavicle, initial encounter for closed fracture: Secondary | ICD-10-CM | POA: Diagnosis not present

## 2022-12-27 DIAGNOSIS — M542 Cervicalgia: Secondary | ICD-10-CM | POA: Diagnosis not present

## 2022-12-27 DIAGNOSIS — S42009A Fracture of unspecified part of unspecified clavicle, initial encounter for closed fracture: Secondary | ICD-10-CM | POA: Diagnosis not present

## 2022-12-27 DIAGNOSIS — R293 Abnormal posture: Secondary | ICD-10-CM | POA: Diagnosis not present

## 2023-01-03 DIAGNOSIS — M542 Cervicalgia: Secondary | ICD-10-CM | POA: Diagnosis not present

## 2023-01-03 DIAGNOSIS — R293 Abnormal posture: Secondary | ICD-10-CM | POA: Diagnosis not present

## 2023-01-03 DIAGNOSIS — S42009A Fracture of unspecified part of unspecified clavicle, initial encounter for closed fracture: Secondary | ICD-10-CM | POA: Diagnosis not present

## 2023-01-05 DIAGNOSIS — M542 Cervicalgia: Secondary | ICD-10-CM | POA: Diagnosis not present

## 2023-01-05 DIAGNOSIS — Z23 Encounter for immunization: Secondary | ICD-10-CM | POA: Diagnosis not present

## 2023-01-05 DIAGNOSIS — S42009A Fracture of unspecified part of unspecified clavicle, initial encounter for closed fracture: Secondary | ICD-10-CM | POA: Diagnosis not present

## 2023-01-05 DIAGNOSIS — R293 Abnormal posture: Secondary | ICD-10-CM | POA: Diagnosis not present

## 2023-01-10 DIAGNOSIS — M542 Cervicalgia: Secondary | ICD-10-CM | POA: Diagnosis not present

## 2023-01-10 DIAGNOSIS — S42009A Fracture of unspecified part of unspecified clavicle, initial encounter for closed fracture: Secondary | ICD-10-CM | POA: Diagnosis not present

## 2023-01-10 DIAGNOSIS — E7800 Pure hypercholesterolemia, unspecified: Secondary | ICD-10-CM | POA: Diagnosis not present

## 2023-01-10 DIAGNOSIS — R293 Abnormal posture: Secondary | ICD-10-CM | POA: Diagnosis not present

## 2023-01-12 DIAGNOSIS — R293 Abnormal posture: Secondary | ICD-10-CM | POA: Diagnosis not present

## 2023-01-12 DIAGNOSIS — M542 Cervicalgia: Secondary | ICD-10-CM | POA: Diagnosis not present

## 2023-01-12 DIAGNOSIS — S42009A Fracture of unspecified part of unspecified clavicle, initial encounter for closed fracture: Secondary | ICD-10-CM | POA: Diagnosis not present

## 2023-01-12 LAB — LIPID PANEL WITH LDL/HDL RATIO
Cholesterol, Total: 116 mg/dL (ref 100–199)
HDL: 32 mg/dL — ABNORMAL LOW (ref >39)
LDL Chol Calc (NIH): 59 mg/dL (ref 0–99)
LDL/HDL Ratio: 1.8 ratio (ref 0.0–3.2)
Triglycerides: 142 mg/dL (ref 0–149)
VLDL Cholesterol Cal: 25 mg/dL (ref 5–40)

## 2023-01-12 LAB — LIPOPROTEIN A (LPA): Lipoprotein (a): 230.8 nmol/L — ABNORMAL HIGH (ref <75.0)

## 2023-01-12 NOTE — Progress Notes (Signed)
Will screen the patient for ACCLAIM-Lp(a)-assess the effect of Lipodisiran SQ yearly on the reduction of Mace in patients with LPA >175 and established ASCVD

## 2023-01-12 NOTE — Progress Notes (Signed)
Lipoprotein (a) <75.0 nmol/L 01/10/2023:      230.8 High

## 2023-01-14 DIAGNOSIS — R293 Abnormal posture: Secondary | ICD-10-CM | POA: Diagnosis not present

## 2023-01-14 DIAGNOSIS — M542 Cervicalgia: Secondary | ICD-10-CM | POA: Diagnosis not present

## 2023-01-14 DIAGNOSIS — S42009A Fracture of unspecified part of unspecified clavicle, initial encounter for closed fracture: Secondary | ICD-10-CM | POA: Diagnosis not present

## 2023-01-17 DIAGNOSIS — S42009A Fracture of unspecified part of unspecified clavicle, initial encounter for closed fracture: Secondary | ICD-10-CM | POA: Diagnosis not present

## 2023-01-17 DIAGNOSIS — M542 Cervicalgia: Secondary | ICD-10-CM | POA: Diagnosis not present

## 2023-01-17 DIAGNOSIS — R293 Abnormal posture: Secondary | ICD-10-CM | POA: Diagnosis not present

## 2023-01-24 DIAGNOSIS — I10 Essential (primary) hypertension: Secondary | ICD-10-CM | POA: Diagnosis not present

## 2023-01-24 DIAGNOSIS — E782 Mixed hyperlipidemia: Secondary | ICD-10-CM | POA: Diagnosis not present

## 2023-01-24 DIAGNOSIS — R7303 Prediabetes: Secondary | ICD-10-CM | POA: Diagnosis not present

## 2023-01-24 DIAGNOSIS — I2510 Atherosclerotic heart disease of native coronary artery without angina pectoris: Secondary | ICD-10-CM | POA: Diagnosis not present

## 2023-01-24 DIAGNOSIS — I700 Atherosclerosis of aorta: Secondary | ICD-10-CM | POA: Diagnosis not present

## 2023-01-24 DIAGNOSIS — R072 Precordial pain: Secondary | ICD-10-CM | POA: Diagnosis not present

## 2023-01-24 DIAGNOSIS — I2584 Coronary atherosclerosis due to calcified coronary lesion: Secondary | ICD-10-CM | POA: Diagnosis not present

## 2023-02-04 DIAGNOSIS — J069 Acute upper respiratory infection, unspecified: Secondary | ICD-10-CM | POA: Diagnosis not present

## 2023-02-04 DIAGNOSIS — E7800 Pure hypercholesterolemia, unspecified: Secondary | ICD-10-CM | POA: Diagnosis not present

## 2023-02-09 DIAGNOSIS — S42009A Fracture of unspecified part of unspecified clavicle, initial encounter for closed fracture: Secondary | ICD-10-CM | POA: Diagnosis not present

## 2023-02-09 DIAGNOSIS — M542 Cervicalgia: Secondary | ICD-10-CM | POA: Diagnosis not present

## 2023-02-09 DIAGNOSIS — R293 Abnormal posture: Secondary | ICD-10-CM | POA: Diagnosis not present

## 2023-02-11 DIAGNOSIS — M542 Cervicalgia: Secondary | ICD-10-CM | POA: Diagnosis not present

## 2023-02-11 DIAGNOSIS — S42009A Fracture of unspecified part of unspecified clavicle, initial encounter for closed fracture: Secondary | ICD-10-CM | POA: Diagnosis not present

## 2023-02-11 DIAGNOSIS — R293 Abnormal posture: Secondary | ICD-10-CM | POA: Diagnosis not present

## 2023-02-14 DIAGNOSIS — R293 Abnormal posture: Secondary | ICD-10-CM | POA: Diagnosis not present

## 2023-02-14 DIAGNOSIS — M542 Cervicalgia: Secondary | ICD-10-CM | POA: Diagnosis not present

## 2023-02-14 DIAGNOSIS — S42009A Fracture of unspecified part of unspecified clavicle, initial encounter for closed fracture: Secondary | ICD-10-CM | POA: Diagnosis not present

## 2023-02-16 DIAGNOSIS — S42009A Fracture of unspecified part of unspecified clavicle, initial encounter for closed fracture: Secondary | ICD-10-CM | POA: Diagnosis not present

## 2023-02-16 DIAGNOSIS — R293 Abnormal posture: Secondary | ICD-10-CM | POA: Diagnosis not present

## 2023-02-16 DIAGNOSIS — M542 Cervicalgia: Secondary | ICD-10-CM | POA: Diagnosis not present

## 2023-02-21 DIAGNOSIS — S42009A Fracture of unspecified part of unspecified clavicle, initial encounter for closed fracture: Secondary | ICD-10-CM | POA: Diagnosis not present

## 2023-02-21 DIAGNOSIS — M542 Cervicalgia: Secondary | ICD-10-CM | POA: Diagnosis not present

## 2023-02-21 DIAGNOSIS — S42021A Displaced fracture of shaft of right clavicle, initial encounter for closed fracture: Secondary | ICD-10-CM | POA: Diagnosis not present

## 2023-02-21 DIAGNOSIS — R293 Abnormal posture: Secondary | ICD-10-CM | POA: Diagnosis not present

## 2023-02-21 DIAGNOSIS — S42021D Displaced fracture of shaft of right clavicle, subsequent encounter for fracture with routine healing: Secondary | ICD-10-CM | POA: Diagnosis not present

## 2023-02-28 DIAGNOSIS — R293 Abnormal posture: Secondary | ICD-10-CM | POA: Diagnosis not present

## 2023-02-28 DIAGNOSIS — M542 Cervicalgia: Secondary | ICD-10-CM | POA: Diagnosis not present

## 2023-02-28 DIAGNOSIS — S42009A Fracture of unspecified part of unspecified clavicle, initial encounter for closed fracture: Secondary | ICD-10-CM | POA: Diagnosis not present

## 2023-03-04 DIAGNOSIS — M542 Cervicalgia: Secondary | ICD-10-CM | POA: Diagnosis not present

## 2023-03-04 DIAGNOSIS — R293 Abnormal posture: Secondary | ICD-10-CM | POA: Diagnosis not present

## 2023-03-04 DIAGNOSIS — S42009A Fracture of unspecified part of unspecified clavicle, initial encounter for closed fracture: Secondary | ICD-10-CM | POA: Diagnosis not present

## 2023-03-07 DIAGNOSIS — S42009A Fracture of unspecified part of unspecified clavicle, initial encounter for closed fracture: Secondary | ICD-10-CM | POA: Diagnosis not present

## 2023-03-07 DIAGNOSIS — M542 Cervicalgia: Secondary | ICD-10-CM | POA: Diagnosis not present

## 2023-03-07 DIAGNOSIS — R293 Abnormal posture: Secondary | ICD-10-CM | POA: Diagnosis not present

## 2023-03-09 DIAGNOSIS — R293 Abnormal posture: Secondary | ICD-10-CM | POA: Diagnosis not present

## 2023-03-09 DIAGNOSIS — S42009A Fracture of unspecified part of unspecified clavicle, initial encounter for closed fracture: Secondary | ICD-10-CM | POA: Diagnosis not present

## 2023-03-09 DIAGNOSIS — M542 Cervicalgia: Secondary | ICD-10-CM | POA: Diagnosis not present

## 2023-03-14 DIAGNOSIS — S42009A Fracture of unspecified part of unspecified clavicle, initial encounter for closed fracture: Secondary | ICD-10-CM | POA: Diagnosis not present

## 2023-03-14 DIAGNOSIS — R293 Abnormal posture: Secondary | ICD-10-CM | POA: Diagnosis not present

## 2023-03-14 DIAGNOSIS — M542 Cervicalgia: Secondary | ICD-10-CM | POA: Diagnosis not present

## 2023-03-16 DIAGNOSIS — M542 Cervicalgia: Secondary | ICD-10-CM | POA: Diagnosis not present

## 2023-03-16 DIAGNOSIS — S42009A Fracture of unspecified part of unspecified clavicle, initial encounter for closed fracture: Secondary | ICD-10-CM | POA: Diagnosis not present

## 2023-03-16 DIAGNOSIS — R293 Abnormal posture: Secondary | ICD-10-CM | POA: Diagnosis not present

## 2023-03-21 DIAGNOSIS — M542 Cervicalgia: Secondary | ICD-10-CM | POA: Diagnosis not present

## 2023-03-21 DIAGNOSIS — S42009A Fracture of unspecified part of unspecified clavicle, initial encounter for closed fracture: Secondary | ICD-10-CM | POA: Diagnosis not present

## 2023-03-21 DIAGNOSIS — R293 Abnormal posture: Secondary | ICD-10-CM | POA: Diagnosis not present

## 2023-03-24 DIAGNOSIS — S42009A Fracture of unspecified part of unspecified clavicle, initial encounter for closed fracture: Secondary | ICD-10-CM | POA: Diagnosis not present

## 2023-03-24 DIAGNOSIS — R293 Abnormal posture: Secondary | ICD-10-CM | POA: Diagnosis not present

## 2023-03-24 DIAGNOSIS — M542 Cervicalgia: Secondary | ICD-10-CM | POA: Diagnosis not present

## 2023-03-29 DIAGNOSIS — R293 Abnormal posture: Secondary | ICD-10-CM | POA: Diagnosis not present

## 2023-03-29 DIAGNOSIS — S42009A Fracture of unspecified part of unspecified clavicle, initial encounter for closed fracture: Secondary | ICD-10-CM | POA: Diagnosis not present

## 2023-03-29 DIAGNOSIS — M542 Cervicalgia: Secondary | ICD-10-CM | POA: Diagnosis not present

## 2023-03-31 DIAGNOSIS — R293 Abnormal posture: Secondary | ICD-10-CM | POA: Diagnosis not present

## 2023-03-31 DIAGNOSIS — S42009A Fracture of unspecified part of unspecified clavicle, initial encounter for closed fracture: Secondary | ICD-10-CM | POA: Diagnosis not present

## 2023-03-31 DIAGNOSIS — M542 Cervicalgia: Secondary | ICD-10-CM | POA: Diagnosis not present

## 2023-04-05 DIAGNOSIS — R293 Abnormal posture: Secondary | ICD-10-CM | POA: Diagnosis not present

## 2023-04-05 DIAGNOSIS — M542 Cervicalgia: Secondary | ICD-10-CM | POA: Diagnosis not present

## 2023-04-05 DIAGNOSIS — S42009A Fracture of unspecified part of unspecified clavicle, initial encounter for closed fracture: Secondary | ICD-10-CM | POA: Diagnosis not present

## 2023-04-07 DIAGNOSIS — S42009A Fracture of unspecified part of unspecified clavicle, initial encounter for closed fracture: Secondary | ICD-10-CM | POA: Diagnosis not present

## 2023-04-07 DIAGNOSIS — R293 Abnormal posture: Secondary | ICD-10-CM | POA: Diagnosis not present

## 2023-04-07 DIAGNOSIS — M542 Cervicalgia: Secondary | ICD-10-CM | POA: Diagnosis not present

## 2023-04-12 DIAGNOSIS — S42009A Fracture of unspecified part of unspecified clavicle, initial encounter for closed fracture: Secondary | ICD-10-CM | POA: Diagnosis not present

## 2023-04-12 DIAGNOSIS — R293 Abnormal posture: Secondary | ICD-10-CM | POA: Diagnosis not present

## 2023-04-12 DIAGNOSIS — M542 Cervicalgia: Secondary | ICD-10-CM | POA: Diagnosis not present

## 2023-04-14 DIAGNOSIS — S42009A Fracture of unspecified part of unspecified clavicle, initial encounter for closed fracture: Secondary | ICD-10-CM | POA: Diagnosis not present

## 2023-04-14 DIAGNOSIS — M542 Cervicalgia: Secondary | ICD-10-CM | POA: Diagnosis not present

## 2023-04-14 DIAGNOSIS — R293 Abnormal posture: Secondary | ICD-10-CM | POA: Diagnosis not present

## 2023-04-19 DIAGNOSIS — S42009A Fracture of unspecified part of unspecified clavicle, initial encounter for closed fracture: Secondary | ICD-10-CM | POA: Diagnosis not present

## 2023-04-19 DIAGNOSIS — R293 Abnormal posture: Secondary | ICD-10-CM | POA: Diagnosis not present

## 2023-04-19 DIAGNOSIS — M542 Cervicalgia: Secondary | ICD-10-CM | POA: Diagnosis not present

## 2023-04-21 DIAGNOSIS — R293 Abnormal posture: Secondary | ICD-10-CM | POA: Diagnosis not present

## 2023-04-21 DIAGNOSIS — S42009A Fracture of unspecified part of unspecified clavicle, initial encounter for closed fracture: Secondary | ICD-10-CM | POA: Diagnosis not present

## 2023-04-21 DIAGNOSIS — M542 Cervicalgia: Secondary | ICD-10-CM | POA: Diagnosis not present

## 2023-04-26 DIAGNOSIS — R293 Abnormal posture: Secondary | ICD-10-CM | POA: Diagnosis not present

## 2023-04-26 DIAGNOSIS — S42009A Fracture of unspecified part of unspecified clavicle, initial encounter for closed fracture: Secondary | ICD-10-CM | POA: Diagnosis not present

## 2023-04-26 DIAGNOSIS — M542 Cervicalgia: Secondary | ICD-10-CM | POA: Diagnosis not present

## 2023-04-28 DIAGNOSIS — M542 Cervicalgia: Secondary | ICD-10-CM | POA: Diagnosis not present

## 2023-04-28 DIAGNOSIS — S42009A Fracture of unspecified part of unspecified clavicle, initial encounter for closed fracture: Secondary | ICD-10-CM | POA: Diagnosis not present

## 2023-04-28 DIAGNOSIS — R293 Abnormal posture: Secondary | ICD-10-CM | POA: Diagnosis not present

## 2023-05-02 ENCOUNTER — Ambulatory Visit: Payer: Medicare Other | Admitting: Cardiology

## 2023-05-02 ENCOUNTER — Encounter: Payer: Self-pay | Admitting: Cardiology

## 2023-05-02 VITALS — BP 115/80 | HR 70 | Resp 16 | Ht 61.0 in | Wt 112.8 lb

## 2023-05-02 DIAGNOSIS — R9439 Abnormal result of other cardiovascular function study: Secondary | ICD-10-CM

## 2023-05-02 DIAGNOSIS — E7800 Pure hypercholesterolemia, unspecified: Secondary | ICD-10-CM

## 2023-05-02 DIAGNOSIS — E7841 Elevated Lipoprotein(a): Secondary | ICD-10-CM | POA: Diagnosis not present

## 2023-05-02 DIAGNOSIS — R931 Abnormal findings on diagnostic imaging of heart and coronary circulation: Secondary | ICD-10-CM | POA: Diagnosis not present

## 2023-05-02 NOTE — Progress Notes (Signed)
Primary Physician/Referring:  Merri Brunette, MD  Patient ID: Kelsey Reason, MD, female    DOB: 12-Apr-1951, 72 y.o.   MRN: [id number]  Chief Complaint  Patient presents with   Coronary Artery Disease   Hypertension   Follow-up    6 months   HPI:    [facility] Bjorn Loser, MD  is a 72 y.o. 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  Elevated coronary calcium score in the 84th percentile.  This is 62-month office visit.  There is also question about amaurosis fugax in 2021.  She remains asymptomatic.    Past Medical History:  Diagnosis Date   Anemia    history of   GERD (gastroesophageal reflux disease)    History of blood transfusion 10/09/2013   History of Clostridium difficile colitis    Hypercholesteremia    Hyperlipidemia    Hypothyroidism    Leg pain    weakness, generalized, history of   Osteoarthritis    rt hip,    Thyroid disease    Past Surgical History:  Procedure Laterality Date   BILATERAL ANTERIOR TOTAL HIP ARTHROPLASTY Bilateral 10/09/2013   Procedure: BILATERAL ANTERIOR TOTAL HIP ARTHROPLASTY;  Surgeon: Kathryne Hitch, MD;  Location: [facility];  Service: Orthopedics;  Laterality: Bilateral;   COLONOSCOPY     CYSTOCELE REPAIR N/A 09/01/2017   Procedure: ANTERIOR REPAIR (CYSTOCELE);  Surgeon: Alfredo Martinez, MD;  Location: [facility];  Service: Urology;  Laterality: N/A;   CYSTOSCOPY N/A 09/01/2017   Procedure: CYSTOSCOPY;  Surgeon: Alfredo Martinez, MD;  Location: [facility];  Service: Urology;  Laterality: N/A;   DILATION AND CURETTAGE OF UTERUS  1989   for miscarriage   INGUINAL HERNIA REPAIR  09/01/2012   Procedure: HERNIA REPAIR INGUINAL  ADULT;  Surgeon: Valarie Merino, MD;  Location: [facility];  Service: General;  Laterality: Right;  laparoscopic assisted   INSERTION OF MESH  09/01/2012   Procedure: INSERTION OF MESH;  Surgeon: Valarie Merino, MD;  Location: [facility];  Service: General;  Laterality: Right;  spigelian and inguinal   LAPAROSCOPIC ABDOMINAL EXPLORATION  09/01/2012   Procedure: LAPAROSCOPIC ABDOMINAL EXPLORATION;  Surgeon: Valarie Merino, MD;  Location: [facility];  Service: General;  Laterality: N/A;   LAPAROSCOPIC VAGINAL HYSTERECTOMY WITH SALPINGO OOPHORECTOMY Bilateral 09/01/2017   Procedure: LAPAROSCOPIC ASSISTED VAGINAL HYSTERECTOMY WITH  BILATERAL SALPINGO OOPHORECTOMY;  Surgeon: Maxie Better, MD;  Location: [facility];  Service: Gynecology;  Laterality: Bilateral;   SPIGELIAN HERNIA  09/01/2012   Procedure: SPIGELIAN HERNIA;  Surgeon: Valarie Merino, MD;  Location: [facility];  Service: General;  Laterality: Right;  laparoscopic assisted   VAGINAL PROLAPSE REPAIR N/A 09/01/2017   Procedure: VAGINAL VAULT SUSPENSION AND GRAFT;  Surgeon: Alfredo Martinez, MD;  Location: [facility];  Service: Urology;  Laterality: N/A;   Family History  Problem Relation Age of Onset   Cancer Mother        breast   Stroke Mother    Breast cancer Mother    Diabetes Father    Multiple sclerosis Sister    Multiple sclerosis Maternal Aunt     Social History   Tobacco Use   Smoking status: Never   Smokeless tobacco: Never  Substance Use  Topics   Alcohol use: Yes    Comment: rare   Marital Status: Divorced  ROS  Review of Systems  Cardiovascular:  Negative for chest pain, dyspnea on exertion and leg swelling.   Objective      05/02/2023    3:29 PM 11/01/2022    3:31 PM 10/11/2022    1:25 PM  Vitals with BMI  Height 5\' 1"  5\' 1"  5\' 1"   Weight 112 lbs 13 oz 114 lbs 10 oz 115 lbs 13 oz  BMI 21.32 21.66 21.89  Systolic 115 127 098  Diastolic 80 83 76  Pulse 70 73 73   SpO2: 96 %  Physical Exam Neck:     Vascular:  No carotid bruit or JVD.  Cardiovascular:     Rate and Rhythm: Normal rate and regular rhythm.     Pulses: Intact distal pulses.     Heart sounds: Normal heart sounds. No murmur heard.    No gallop.  Pulmonary:     Effort: Pulmonary effort is normal.     Breath sounds: Normal breath sounds.  Abdominal:     General: Bowel sounds are normal.     Palpations: Abdomen is soft.  Musculoskeletal:     Right lower leg: No edema.     Left lower leg: No edema.     Medications and allergies  No Known Allergies   Medication list   Current Outpatient Medications:    aspirin EC 81 MG tablet, Take 81 mg by mouth daily. Swallow whole., Disp: , Rfl:    Calcium Carbonate (CALCIUM 600 PO), Take 600 mg by mouth., Disp: , Rfl:    Cholecalciferol (VITAMIN D3) 2000 units TABS, Take 2,000 Units by mouth., Disp: , Rfl:    ezetimibe (ZETIA) 10 MG tablet, Take 10 mg by mouth daily., Disp: , Rfl:    ibuprofen (ADVIL,MOTRIN) 800 MG tablet, Take 1 tablet (800 mg total) by mouth every 8 (eight) hours as needed (mild pain)., Disp: 30 tablet, Rfl: 0   levothyroxine (SYNTHROID, LEVOTHROID) 50 MCG tablet, Take 50 mcg by mouth daily before breakfast. , Disp: , Rfl:    Multiple Vitamin (MULTIVITAMIN) tablet, Take 1 tablet by mouth daily., Disp: , Rfl:    omeprazole (PRILOSEC) 20 MG capsule, Take 20 mg by mouth daily., Disp: , Rfl:    rosuvastatin (CRESTOR) 40 MG tablet, Take 1 tablet (40 mg total) by mouth as directed. Take 20 mg and 40 mg alternating daily, Disp: 90 tablet, Rfl: 3 Laboratory examination:   Lipoprotein (a) <75.0 nmol/L 01/10/2023:      230.8 High   Lab Results  Component Value Date   CHOL 116 01/10/2023   HDL 32 (L) 01/10/2023   LDLCALC 59 01/10/2023   TRIG 142 01/10/2023    External labs:   Labs 04/13/2022:  Sodium 141, potassium 4.3, BUN 21, creatinine 0.70, LFTs normal, EGFR 86 mL.  Hb 13.4/HCT 40.1, platelets 213, normal indicis.  TSH normal at 2.23.  Total cholesterol 142,  triglycerides 90, HDL 44, LDL 88.  Non-HDL cholesterol 98.  Radiology:    Cardiac Studies:   Carotid artery duplex 03/08/20: Right Carotid: Velocities in the right ICA are consistent with a 1-39%  stenosis, low end of range. Minimal plaque visualized.   Left Carotid: Velocities in the left ICA are consistent with a 1-39%  stenosis, low end of range. Minimal plaque visualized.   Vertebrals:  Bilateral vertebral arteries demonstrate antegrade flow.  Subclavians: Normal flow hemodynamics were seen in bilateral subclavian  arteries.   Coronary calcium score 09/14/2022: LM 0 LAD 57.4 LCx 0 RCA 299. Total Agagston stent score 357, MESA database percentile: 84   PCV ECHOCARDIOGRAM COMPLETE 10/13/2022  Narrative Echocardiogram 10/13/2022: Hyperdynamic LV systolic function with visual EF >70%. Left ventricle cavity is normal in size. Normal left ventricular wall thickness. Presence of a septal bulge. Normal global wall motion. Normal diastolic filling pattern, normal LAP. Mild pulmonic regurgitation. No prior study for comparison.    PCV MYOCARDIAL PERFUSION WO LEXISCAN 10/25/2022  Narrative Exercise Tetrofosmin stress test 10/25/2022: Exercise nuclear stress test was performed using Bruce protocol. Patient reached 7 METS, and 90% of age predicted maximum heart rate. Exercise capacity was fair. No chest pain reported. Heart rate and hemodynamic response were normal. Stress EKG revealed no ischemic changes. SPECT images show very small sized, mild intensity, reversible, apical perfusion defect. Normal myocardial wall motion and thickening. Stress LVEF 61%. Low risk study.   EKG:   EKG 05/02/2023: Normal sinus rhythm with rate of 74 bpm, normal EKG.  Compared to 10/11/2022,, inferior T wave abnormality not well-appreciated in the present EKG.  Assessment     ICD-10-CM   1. Pure hypercholesterolemia  E78.00     2. Elevated Lp(a)  E78.41     3. Abnormal nuclear stress test  R94.39  EKG 12-Lead    4. Elevated coronary artery calcium score 09/14/2022: Total Agagston score 357, MESA database percentile: 84  R93.1       Orders Placed This Encounter  Procedures   EKG 12-Lead   No orders of the defined types were placed in this encounter.  There are no discontinued medications.    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  Elevated coronary calcium score in the 84th percentile.  This is 79-month office visit.  There is also question about amaurosis fugax in 2021.  1. Pure hypercholesterolemia I reviewed the results of the lipids, lipids in excellent control and she is tolerating Crestor 40 mg every other day alternating by 20 mg every other day along with Zetia.  She has been given options of being in clinical trials for elevated Lp(a), for now she prefers to continue medical therapy only with the present medications.  2. Elevated Lp(a) As dictated above, we could certainly switch her to Repatha however she is at most intermediate risk going forward at the age of 48 and completely asymptomatic, as LDL is now at goal, will continue with statin along with Zetia.  3. Abnormal nuclear stress test She has a very small apical perfusion defect by nuclear stress test, overall low risk.  As she is asymptomatic and now she is on medical therapy, continue the same for now along with aspirin 81 mg daily. - EKG 12-Lead  4. Elevated coronary artery calcium score 09/14/2022: Total Agagston score 357, MESA database percentile: 84 Please see above dictation.  She has been well-managed with regard to risk  modification, as she is on appropriate medical therapy, all questions answered, I will see her back on a as needed basis.    Yates Decamp, MD, [facility] 05/02/2023, 3:55 PM Office: [phone]

## 2023-05-03 DIAGNOSIS — S42009A Fracture of unspecified part of unspecified clavicle, initial encounter for closed fracture: Secondary | ICD-10-CM | POA: Diagnosis not present

## 2023-05-03 DIAGNOSIS — R293 Abnormal posture: Secondary | ICD-10-CM | POA: Diagnosis not present

## 2023-05-03 DIAGNOSIS — M542 Cervicalgia: Secondary | ICD-10-CM | POA: Diagnosis not present

## 2023-05-04 DIAGNOSIS — E7800 Pure hypercholesterolemia, unspecified: Secondary | ICD-10-CM | POA: Diagnosis not present

## 2023-05-04 DIAGNOSIS — E039 Hypothyroidism, unspecified: Secondary | ICD-10-CM | POA: Diagnosis not present

## 2023-05-05 DIAGNOSIS — M542 Cervicalgia: Secondary | ICD-10-CM | POA: Diagnosis not present

## 2023-05-05 DIAGNOSIS — S42009A Fracture of unspecified part of unspecified clavicle, initial encounter for closed fracture: Secondary | ICD-10-CM | POA: Diagnosis not present

## 2023-05-05 DIAGNOSIS — R293 Abnormal posture: Secondary | ICD-10-CM | POA: Diagnosis not present

## 2023-05-09 DIAGNOSIS — I2510 Atherosclerotic heart disease of native coronary artery without angina pectoris: Secondary | ICD-10-CM | POA: Diagnosis not present

## 2023-05-09 DIAGNOSIS — E039 Hypothyroidism, unspecified: Secondary | ICD-10-CM | POA: Diagnosis not present

## 2023-05-09 DIAGNOSIS — I700 Atherosclerosis of aorta: Secondary | ICD-10-CM | POA: Diagnosis not present

## 2023-05-09 DIAGNOSIS — Z0000 Encounter for general adult medical examination without abnormal findings: Secondary | ICD-10-CM | POA: Diagnosis not present

## 2023-05-09 DIAGNOSIS — M958 Other specified acquired deformities of musculoskeletal system: Secondary | ICD-10-CM | POA: Diagnosis not present

## 2023-05-09 DIAGNOSIS — I6523 Occlusion and stenosis of bilateral carotid arteries: Secondary | ICD-10-CM | POA: Diagnosis not present

## 2023-05-09 DIAGNOSIS — K219 Gastro-esophageal reflux disease without esophagitis: Secondary | ICD-10-CM | POA: Diagnosis not present

## 2023-05-09 DIAGNOSIS — E559 Vitamin D deficiency, unspecified: Secondary | ICD-10-CM | POA: Diagnosis not present

## 2023-05-10 ENCOUNTER — Other Ambulatory Visit: Payer: Self-pay | Admitting: Medical Genetics

## 2023-05-10 DIAGNOSIS — S42009A Fracture of unspecified part of unspecified clavicle, initial encounter for closed fracture: Secondary | ICD-10-CM | POA: Diagnosis not present

## 2023-05-10 DIAGNOSIS — M542 Cervicalgia: Secondary | ICD-10-CM | POA: Diagnosis not present

## 2023-05-10 DIAGNOSIS — Z006 Encounter for examination for normal comparison and control in clinical research program: Secondary | ICD-10-CM

## 2023-05-10 DIAGNOSIS — R293 Abnormal posture: Secondary | ICD-10-CM | POA: Diagnosis not present

## 2023-05-12 DIAGNOSIS — S42009A Fracture of unspecified part of unspecified clavicle, initial encounter for closed fracture: Secondary | ICD-10-CM | POA: Diagnosis not present

## 2023-05-12 DIAGNOSIS — R293 Abnormal posture: Secondary | ICD-10-CM | POA: Diagnosis not present

## 2023-05-12 DIAGNOSIS — M542 Cervicalgia: Secondary | ICD-10-CM | POA: Diagnosis not present

## 2023-05-13 ENCOUNTER — Other Ambulatory Visit (HOSPITAL_COMMUNITY)
Admission: RE | Admit: 2023-05-13 | Discharge: 2023-05-13 | Disposition: A | Discharge status: Home / Self Care | Payer: Medicare Other | Attending: Oncology | Admitting: Oncology

## 2023-05-13 DIAGNOSIS — Z006 Encounter for examination for normal comparison and control in clinical research program: Secondary | ICD-10-CM | POA: Insufficient documentation

## 2023-05-17 DIAGNOSIS — S42009A Fracture of unspecified part of unspecified clavicle, initial encounter for closed fracture: Secondary | ICD-10-CM | POA: Diagnosis not present

## 2023-05-17 DIAGNOSIS — M542 Cervicalgia: Secondary | ICD-10-CM | POA: Diagnosis not present

## 2023-05-17 DIAGNOSIS — R293 Abnormal posture: Secondary | ICD-10-CM | POA: Diagnosis not present

## 2023-05-20 DIAGNOSIS — S42009A Fracture of unspecified part of unspecified clavicle, initial encounter for closed fracture: Secondary | ICD-10-CM | POA: Diagnosis not present

## 2023-05-20 DIAGNOSIS — M542 Cervicalgia: Secondary | ICD-10-CM | POA: Diagnosis not present

## 2023-05-20 DIAGNOSIS — R293 Abnormal posture: Secondary | ICD-10-CM | POA: Diagnosis not present

## 2023-05-24 LAB — GENECONNECT MOLECULAR SCREEN: Genetic Analysis Overall Interpretation: NEGATIVE

## 2023-05-26 DIAGNOSIS — S42009A Fracture of unspecified part of unspecified clavicle, initial encounter for closed fracture: Secondary | ICD-10-CM | POA: Diagnosis not present

## 2023-05-26 DIAGNOSIS — M542 Cervicalgia: Secondary | ICD-10-CM | POA: Diagnosis not present

## 2023-05-26 DIAGNOSIS — R293 Abnormal posture: Secondary | ICD-10-CM | POA: Diagnosis not present

## 2023-05-30 DIAGNOSIS — S42009A Fracture of unspecified part of unspecified clavicle, initial encounter for closed fracture: Secondary | ICD-10-CM | POA: Diagnosis not present

## 2023-05-30 DIAGNOSIS — R293 Abnormal posture: Secondary | ICD-10-CM | POA: Diagnosis not present

## 2023-05-30 DIAGNOSIS — M542 Cervicalgia: Secondary | ICD-10-CM | POA: Diagnosis not present

## 2023-06-03 DIAGNOSIS — M542 Cervicalgia: Secondary | ICD-10-CM | POA: Diagnosis not present

## 2023-06-03 DIAGNOSIS — S42009A Fracture of unspecified part of unspecified clavicle, initial encounter for closed fracture: Secondary | ICD-10-CM | POA: Diagnosis not present

## 2023-06-03 DIAGNOSIS — R293 Abnormal posture: Secondary | ICD-10-CM | POA: Diagnosis not present

## 2023-06-06 DIAGNOSIS — S42009A Fracture of unspecified part of unspecified clavicle, initial encounter for closed fracture: Secondary | ICD-10-CM | POA: Diagnosis not present

## 2023-06-06 DIAGNOSIS — R293 Abnormal posture: Secondary | ICD-10-CM | POA: Diagnosis not present

## 2023-06-06 DIAGNOSIS — M542 Cervicalgia: Secondary | ICD-10-CM | POA: Diagnosis not present

## 2023-06-09 DIAGNOSIS — M542 Cervicalgia: Secondary | ICD-10-CM | POA: Diagnosis not present

## 2023-06-09 DIAGNOSIS — S42009A Fracture of unspecified part of unspecified clavicle, initial encounter for closed fracture: Secondary | ICD-10-CM | POA: Diagnosis not present

## 2023-06-09 DIAGNOSIS — R293 Abnormal posture: Secondary | ICD-10-CM | POA: Diagnosis not present

## 2023-06-13 DIAGNOSIS — S42009A Fracture of unspecified part of unspecified clavicle, initial encounter for closed fracture: Secondary | ICD-10-CM | POA: Diagnosis not present

## 2023-06-13 DIAGNOSIS — M542 Cervicalgia: Secondary | ICD-10-CM | POA: Diagnosis not present

## 2023-06-13 DIAGNOSIS — Z23 Encounter for immunization: Secondary | ICD-10-CM | POA: Diagnosis not present

## 2023-06-13 DIAGNOSIS — R293 Abnormal posture: Secondary | ICD-10-CM | POA: Diagnosis not present

## 2023-06-15 DIAGNOSIS — R293 Abnormal posture: Secondary | ICD-10-CM | POA: Diagnosis not present

## 2023-06-15 DIAGNOSIS — M542 Cervicalgia: Secondary | ICD-10-CM | POA: Diagnosis not present

## 2023-06-15 DIAGNOSIS — S42009A Fracture of unspecified part of unspecified clavicle, initial encounter for closed fracture: Secondary | ICD-10-CM | POA: Diagnosis not present

## 2023-06-20 DIAGNOSIS — S42009A Fracture of unspecified part of unspecified clavicle, initial encounter for closed fracture: Secondary | ICD-10-CM | POA: Diagnosis not present

## 2023-06-20 DIAGNOSIS — M542 Cervicalgia: Secondary | ICD-10-CM | POA: Diagnosis not present

## 2023-06-20 DIAGNOSIS — R293 Abnormal posture: Secondary | ICD-10-CM | POA: Diagnosis not present

## 2023-06-23 DIAGNOSIS — S42009A Fracture of unspecified part of unspecified clavicle, initial encounter for closed fracture: Secondary | ICD-10-CM | POA: Diagnosis not present

## 2023-06-23 DIAGNOSIS — R293 Abnormal posture: Secondary | ICD-10-CM | POA: Diagnosis not present

## 2023-06-23 DIAGNOSIS — M542 Cervicalgia: Secondary | ICD-10-CM | POA: Diagnosis not present

## 2023-06-27 DIAGNOSIS — S42009A Fracture of unspecified part of unspecified clavicle, initial encounter for closed fracture: Secondary | ICD-10-CM | POA: Diagnosis not present

## 2023-06-27 DIAGNOSIS — M542 Cervicalgia: Secondary | ICD-10-CM | POA: Diagnosis not present

## 2023-06-27 DIAGNOSIS — R293 Abnormal posture: Secondary | ICD-10-CM | POA: Diagnosis not present

## 2023-06-30 DIAGNOSIS — Z23 Encounter for immunization: Secondary | ICD-10-CM | POA: Diagnosis not present

## 2023-07-04 DIAGNOSIS — M542 Cervicalgia: Secondary | ICD-10-CM | POA: Diagnosis not present

## 2023-07-04 DIAGNOSIS — S42009A Fracture of unspecified part of unspecified clavicle, initial encounter for closed fracture: Secondary | ICD-10-CM | POA: Diagnosis not present

## 2023-07-04 DIAGNOSIS — R293 Abnormal posture: Secondary | ICD-10-CM | POA: Diagnosis not present

## 2023-07-18 DIAGNOSIS — S42009A Fracture of unspecified part of unspecified clavicle, initial encounter for closed fracture: Secondary | ICD-10-CM | POA: Diagnosis not present

## 2023-07-18 DIAGNOSIS — R293 Abnormal posture: Secondary | ICD-10-CM | POA: Diagnosis not present

## 2023-07-18 DIAGNOSIS — M542 Cervicalgia: Secondary | ICD-10-CM | POA: Diagnosis not present

## 2023-07-21 DIAGNOSIS — R293 Abnormal posture: Secondary | ICD-10-CM | POA: Diagnosis not present

## 2023-07-21 DIAGNOSIS — S42009A Fracture of unspecified part of unspecified clavicle, initial encounter for closed fracture: Secondary | ICD-10-CM | POA: Diagnosis not present

## 2023-07-21 DIAGNOSIS — M542 Cervicalgia: Secondary | ICD-10-CM | POA: Diagnosis not present

## 2023-08-16 DIAGNOSIS — R293 Abnormal posture: Secondary | ICD-10-CM | POA: Diagnosis not present

## 2023-08-16 DIAGNOSIS — S42009A Fracture of unspecified part of unspecified clavicle, initial encounter for closed fracture: Secondary | ICD-10-CM | POA: Diagnosis not present

## 2023-08-16 DIAGNOSIS — M542 Cervicalgia: Secondary | ICD-10-CM | POA: Diagnosis not present

## 2023-08-31 DIAGNOSIS — Z90710 Acquired absence of both cervix and uterus: Secondary | ICD-10-CM | POA: Diagnosis not present

## 2023-08-31 DIAGNOSIS — M8580 Other specified disorders of bone density and structure, unspecified site: Secondary | ICD-10-CM | POA: Diagnosis not present

## 2023-08-31 DIAGNOSIS — E039 Hypothyroidism, unspecified: Secondary | ICD-10-CM | POA: Diagnosis not present

## 2023-08-31 DIAGNOSIS — Z01419 Encounter for gynecological examination (general) (routine) without abnormal findings: Secondary | ICD-10-CM | POA: Diagnosis not present

## 2023-08-31 DIAGNOSIS — N952 Postmenopausal atrophic vaginitis: Secondary | ICD-10-CM | POA: Diagnosis not present

## 2023-09-01 ENCOUNTER — Other Ambulatory Visit: Payer: Self-pay | Admitting: Obstetrics and Gynecology

## 2023-09-01 DIAGNOSIS — M8580 Other specified disorders of bone density and structure, unspecified site: Secondary | ICD-10-CM

## 2023-10-05 DIAGNOSIS — H43813 Vitreous degeneration, bilateral: Secondary | ICD-10-CM | POA: Diagnosis not present

## 2023-10-05 DIAGNOSIS — H2513 Age-related nuclear cataract, bilateral: Secondary | ICD-10-CM | POA: Diagnosis not present

## 2023-10-27 DIAGNOSIS — R350 Frequency of micturition: Secondary | ICD-10-CM | POA: Diagnosis not present

## 2023-10-27 DIAGNOSIS — R300 Dysuria: Secondary | ICD-10-CM | POA: Diagnosis not present

## 2023-11-11 DIAGNOSIS — Z1231 Encounter for screening mammogram for malignant neoplasm of breast: Secondary | ICD-10-CM | POA: Diagnosis not present

## 2023-11-14 DIAGNOSIS — S42021A Displaced fracture of shaft of right clavicle, initial encounter for closed fracture: Secondary | ICD-10-CM | POA: Diagnosis not present

## 2023-12-09 DIAGNOSIS — M25571 Pain in right ankle and joints of right foot: Secondary | ICD-10-CM | POA: Diagnosis not present

## 2023-12-21 DIAGNOSIS — S93401A Sprain of unspecified ligament of right ankle, initial encounter: Secondary | ICD-10-CM | POA: Diagnosis not present

## 2023-12-21 DIAGNOSIS — M25571 Pain in right ankle and joints of right foot: Secondary | ICD-10-CM | POA: Diagnosis not present

## 2023-12-26 DIAGNOSIS — R2689 Other abnormalities of gait and mobility: Secondary | ICD-10-CM | POA: Diagnosis not present

## 2023-12-26 DIAGNOSIS — M25671 Stiffness of right ankle, not elsewhere classified: Secondary | ICD-10-CM | POA: Diagnosis not present

## 2023-12-26 DIAGNOSIS — R531 Weakness: Secondary | ICD-10-CM | POA: Diagnosis not present

## 2023-12-26 DIAGNOSIS — M25571 Pain in right ankle and joints of right foot: Secondary | ICD-10-CM | POA: Diagnosis not present

## 2023-12-29 DIAGNOSIS — R2689 Other abnormalities of gait and mobility: Secondary | ICD-10-CM | POA: Diagnosis not present

## 2023-12-29 DIAGNOSIS — R531 Weakness: Secondary | ICD-10-CM | POA: Diagnosis not present

## 2023-12-29 DIAGNOSIS — M25571 Pain in right ankle and joints of right foot: Secondary | ICD-10-CM | POA: Diagnosis not present

## 2023-12-29 DIAGNOSIS — M25671 Stiffness of right ankle, not elsewhere classified: Secondary | ICD-10-CM | POA: Diagnosis not present

## 2024-01-02 DIAGNOSIS — M25571 Pain in right ankle and joints of right foot: Secondary | ICD-10-CM | POA: Diagnosis not present

## 2024-01-02 DIAGNOSIS — R531 Weakness: Secondary | ICD-10-CM | POA: Diagnosis not present

## 2024-01-02 DIAGNOSIS — R2689 Other abnormalities of gait and mobility: Secondary | ICD-10-CM | POA: Diagnosis not present

## 2024-01-02 DIAGNOSIS — M25671 Stiffness of right ankle, not elsewhere classified: Secondary | ICD-10-CM | POA: Diagnosis not present

## 2024-01-11 DIAGNOSIS — M25671 Stiffness of right ankle, not elsewhere classified: Secondary | ICD-10-CM | POA: Diagnosis not present

## 2024-01-11 DIAGNOSIS — R2689 Other abnormalities of gait and mobility: Secondary | ICD-10-CM | POA: Diagnosis not present

## 2024-01-11 DIAGNOSIS — M25571 Pain in right ankle and joints of right foot: Secondary | ICD-10-CM | POA: Diagnosis not present

## 2024-01-11 DIAGNOSIS — R531 Weakness: Secondary | ICD-10-CM | POA: Diagnosis not present

## 2024-01-16 DIAGNOSIS — M79672 Pain in left foot: Secondary | ICD-10-CM | POA: Diagnosis not present

## 2024-01-16 DIAGNOSIS — S93401A Sprain of unspecified ligament of right ankle, initial encounter: Secondary | ICD-10-CM | POA: Diagnosis not present

## 2024-01-16 DIAGNOSIS — M21612 Bunion of left foot: Secondary | ICD-10-CM | POA: Diagnosis not present

## 2024-05-03 ENCOUNTER — Other Ambulatory Visit: Payer: No Typology Code available for payment source

## 2024-05-07 ENCOUNTER — Ambulatory Visit (HOSPITAL_BASED_OUTPATIENT_CLINIC_OR_DEPARTMENT_OTHER)
Admission: RE | Admit: 2024-05-07 | Discharge: 2024-05-07 | Disposition: A | Discharge status: Home / Self Care | Source: Ambulatory Visit | Attending: Obstetrics and Gynecology | Admitting: Obstetrics and Gynecology

## 2024-05-07 DIAGNOSIS — M8589 Other specified disorders of bone density and structure, multiple sites: Secondary | ICD-10-CM | POA: Insufficient documentation

## 2024-05-07 DIAGNOSIS — M8580 Other specified disorders of bone density and structure, unspecified site: Secondary | ICD-10-CM | POA: Insufficient documentation

## 2024-05-07 DIAGNOSIS — Z13820 Encounter for screening for osteoporosis: Secondary | ICD-10-CM | POA: Insufficient documentation

## 2024-05-07 DIAGNOSIS — Z780 Asymptomatic menopausal state: Secondary | ICD-10-CM | POA: Diagnosis not present

## 2024-05-09 DIAGNOSIS — M79645 Pain in left finger(s): Secondary | ICD-10-CM | POA: Diagnosis not present

## 2024-05-09 DIAGNOSIS — M1812 Unilateral primary osteoarthritis of first carpometacarpal joint, left hand: Secondary | ICD-10-CM | POA: Diagnosis not present

## 2024-05-09 DIAGNOSIS — S63502A Unspecified sprain of left wrist, initial encounter: Secondary | ICD-10-CM | POA: Diagnosis not present

## 2024-05-16 DIAGNOSIS — E7800 Pure hypercholesterolemia, unspecified: Secondary | ICD-10-CM | POA: Diagnosis not present

## 2024-05-16 DIAGNOSIS — E559 Vitamin D deficiency, unspecified: Secondary | ICD-10-CM | POA: Diagnosis not present

## 2024-05-16 DIAGNOSIS — E039 Hypothyroidism, unspecified: Secondary | ICD-10-CM | POA: Diagnosis not present

## 2024-05-23 DIAGNOSIS — Z23 Encounter for immunization: Secondary | ICD-10-CM | POA: Diagnosis not present

## 2024-05-28 DIAGNOSIS — M8580 Other specified disorders of bone density and structure, unspecified site: Secondary | ICD-10-CM | POA: Diagnosis not present

## 2024-05-28 DIAGNOSIS — Z0000 Encounter for general adult medical examination without abnormal findings: Secondary | ICD-10-CM | POA: Diagnosis not present

## 2024-05-28 DIAGNOSIS — E039 Hypothyroidism, unspecified: Secondary | ICD-10-CM | POA: Diagnosis not present

## 2024-05-28 DIAGNOSIS — I2510 Atherosclerotic heart disease of native coronary artery without angina pectoris: Secondary | ICD-10-CM | POA: Diagnosis not present

## 2024-05-28 DIAGNOSIS — I493 Ventricular premature depolarization: Secondary | ICD-10-CM | POA: Diagnosis not present

## 2024-05-28 DIAGNOSIS — E559 Vitamin D deficiency, unspecified: Secondary | ICD-10-CM | POA: Diagnosis not present

## 2024-05-28 DIAGNOSIS — J3489 Other specified disorders of nose and nasal sinuses: Secondary | ICD-10-CM | POA: Diagnosis not present

## 2024-05-28 DIAGNOSIS — K219 Gastro-esophageal reflux disease without esophagitis: Secondary | ICD-10-CM | POA: Diagnosis not present

## 2024-05-28 DIAGNOSIS — I6523 Occlusion and stenosis of bilateral carotid arteries: Secondary | ICD-10-CM | POA: Diagnosis not present

## 2024-06-08 ENCOUNTER — Encounter: Payer: Self-pay | Admitting: Cardiology

## 2024-06-08 ENCOUNTER — Ambulatory Visit: Attending: Cardiology | Admitting: Cardiology

## 2024-06-08 VITALS — BP 118/79 | HR 63 | Resp 16 | Ht 61.0 in | Wt 112.4 lb

## 2024-06-08 DIAGNOSIS — R9439 Abnormal result of other cardiovascular function study: Secondary | ICD-10-CM | POA: Insufficient documentation

## 2024-06-08 DIAGNOSIS — E782 Mixed hyperlipidemia: Secondary | ICD-10-CM | POA: Insufficient documentation

## 2024-06-08 DIAGNOSIS — E7841 Elevated Lipoprotein(a): Secondary | ICD-10-CM | POA: Insufficient documentation

## 2024-06-08 DIAGNOSIS — R931 Abnormal findings on diagnostic imaging of heart and coronary circulation: Secondary | ICD-10-CM | POA: Diagnosis not present

## 2024-06-08 NOTE — Patient Instructions (Signed)

## 2024-06-08 NOTE — Progress Notes (Signed)
 Cardiology Office Note:  .   Date:  06/08/2024  ID:  Kelsey Aldona Seashore, MD, DOB [birth year], MRN [id number] PCP: Clarice Nottingham, MD  [facility] Providers Cardiologist:  Gordy Bergamo, MD   History of Present Illness: .   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  Caucasian female patient, a retired [employer] operational officer with longstanding hypercholesterolemia, strong family history of premature artery disease, brother had CABG in his late 48s and also paternal grandfather also had CABG in his 31s, elevated Lp(a), elevated coronary calcium  score in the 84th percentile and mildly abnormal nuclear stress test on 10/25/2022 with mild apical ischemia, normal LVEF by echocardiogram on 10/13/2022 who is presently on medical therapy without symptoms of any angina.    She was referred back to me for evaluation of PVCs noted on the EKG by PCP and to get a second opinion, I had last seen her a year ago.  She remains asymptomatic and continues to exercise regularly.  Cardiac Studies relevent.    Exercise Tetrofosmin  stress test 10/25/2022: Exercise nuclear stress test was performed using Bruce protocol. Patient reached 7 METS, and 90% of age predicted maximum heart rate. Exercise capacity was fair. No chest pain reported. Heart rate and hemodynamic response were normal. Stress EKG revealed no ischemic changes. SPECT images show very small sized, mild intensity, reversible, apical perfusion defect. Normal myocardial wall motion and thickening. Stress LVEF 61%. Low risk study.  Echocardiogram 10/13/2022: Hyperdynamic LV systolic function with visual EF >70%. Left ventricle cavity is normal in size. Normal left ventricular wall thickness. Presence of a septal bulge. Normal global wall motion. Normal diastolic filling pattern, normal LAP. Mild pulmonic regurgitation.    Discussed the use of AI scribe software for clinical note transcription with the patient, who gave verbal consent to proceed.  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 She was referred by Dr. Nottingham for evaluation of PVCs noted on an EKG.  She received a COVID vaccine on September 10th, and an EKG on September 15th showed PVCs. She exercises regularly and experiences shortness of breath with exertion but denies chest pain or shortness of breath during exercise. An abnormal nuclear stress test in 2024 showed a very mild abnormality at the apex of the heart, but she has not experienced symptoms such as chest pain. Her heart function was normal on an echocardiogram in January 2024.  She has mixed hyperlipidemia and elevated LPA levels. Current medications include azithromycin 10 mg and Crestor  40 mg, with an LDL of 59, HDL of 37, and triglycerides of 135. She lives alone, is socially active, and visits her grandchild in [place].  Labs   Lipoprotein (a)  Date/Time Value Ref Range Status  01/10/2023 08:56 AM 230.8 (H) <75.0 nmol/L Final    Comment:    Note:  Values greater than or equal to 75.0 nmol/L may        indicate an independent risk factor for CHD,        but must be evaluated with caution when applied        to non-Caucasian populations due to the        influence of genetic factors on Lp(a) across        ethnicities.    Care everywhere/Faxed External Labs:  Labs 05/23/2024:  Hb 13.4/HCT 42.8, platelets 223, normal indicis.  Serum glucose 75 mg, BUN 15, creatinine 0.73, eGFR 86  mL, potassium 4.4, LFTs normal.  Total cholesterol 120, triglycerides 135, HDL 37, LDL 59.  TSH normal at 2.390.  ROS  Review of Systems  Cardiovascular:  Negative for chest pain, dyspnea on exertion and leg swelling.   Physical Exam:   VS:  BP 118/79 (BP Location: Left Arm, Patient Position: Sitting, Cuff Size: Normal)   Pulse 63   Resp 16   Ht 5' 1 (1.549 m)   Wt 112 lb 6.4 oz (51 kg)   SpO2 97%   BMI 21.24 kg/m    Wt Readings from Last 3  Encounters:  06/08/24 112 lb 6.4 oz (51 kg)  05/02/23 112 lb 12.8 oz (51.2 kg)  11/01/22 114 lb 9.6 oz (52 kg)    BP Readings from Last 3 Encounters:  06/08/24 118/79  05/02/23 115/80  11/01/22 127/83   Physical Exam Neck:     Vascular: No carotid bruit or JVD.  Cardiovascular:     Rate and Rhythm: Normal rate and regular rhythm.     Pulses: Intact distal pulses.     Heart sounds: Normal heart sounds. No murmur heard.    No gallop.  Pulmonary:     Effort: Pulmonary effort is normal.     Breath sounds: Normal breath sounds.  Abdominal:     General: Bowel sounds are normal.     Palpations: Abdomen is soft.  Musculoskeletal:     Right lower leg: No edema.     Left lower leg: No edema.    EKG:    EKG Interpretation Date/Time:  Friday June 08 2024 11:47:03 EDT Ventricular Rate:  72 PR Interval:  170 QRS Duration:  80 QT Interval:  370 QTC Calculation: 405 R Axis:   3  Text Interpretation: Normal sinus rhythm Normal ECG When compared with ECG of 24-Aug-2017 12:22, No significant change was found Confirmed by Duvan Mousel, Jagadeesh [id number]) on 06/08/2024 12:03:00 PM    ASSESSMENT AND PLAN: .      ICD-10-CM   1. Abnormal nuclear stress test  R94.39     2. Mixed hyperlipidemia  E78.2     3. Elevated Lp(a)  E78.41     4. Elevated coronary artery calcium  score  R93.1 EKG 12-Lead     Assessment & Plan Elevated coronary calcium  score and mildly abnormal nuclear stress test with mild apical reversible abnormality Coronary artery disease with a mild apical abnormality identified on a nuclear stress test in 2024, indicating a potential blockage. Considered very low risk as it is neither life-threatening nor symptom-altering. EKG today is normal and unchanged from previous EKGs. Asymptomatic with no chest pain or dyspnea during regular exercise. Normal heart function on echocardiogram in January 2024. No intervention required unless symptoms develop. - Advise to report any  development of symptoms such as chest pain or dyspnea.  Mixed hyperlipidemia with elevated lipoprotein(a) Mixed hyperlipidemia with elevated lipoprotein(a). Current treatment with azithromycin 10 mg and Crestor  40 mg has achieved optimal cholesterol levels: LDL 59, HDL 37, and triglycerides 864. At age 48, elevated lipoprotein(a) is less concerning as it would likely have caused issues by now if it were going to. Elevated coronary calcium  score is managed with current therapy. Repeating the score is not recommended as it will not provide useful information. - Continue current medications: azithromycin 10 mg and Crestor  40 mg.  Asymptomatic PVCs noted during her annual physical Patient is completely asymptomatic, today EKG does not reveal any significant PVCs, reviewed PCP EKG as well.  I do not think  she needs any further workup in the absence of symptoms as she is very active and continues to exercise almost 7 days a week.  Follow up: As needed.  Signed,  Gordy Bergamo, MD, [facility] 06/08/2024, 12:38 PM [facility] [address] [place], [place] [address] Phone: [phone]. Fax:  [phone]

## 2024-06-19 DIAGNOSIS — Z23 Encounter for immunization: Secondary | ICD-10-CM | POA: Diagnosis not present
